# Patient Record
Sex: Female | Born: 1985 | Race: Black or African American | Hispanic: No | Smoking: Never smoker
Health system: Southern US, Community
[De-identification: ages and names within clinical notes are randomized; demographics above are authoritative.]

## PROBLEM LIST (undated history)

## (undated) DIAGNOSIS — Z789 Other specified health status: Secondary | ICD-10-CM

## (undated) HISTORY — PX: NO PAST SURGERIES: SHX2092

---

## 2005-02-07 ENCOUNTER — Ambulatory Visit (HOSPITAL_COMMUNITY): Admission: RE | Admit: 2005-02-07 | Discharge: 2005-02-07 | Payer: Self-pay | Admitting: Gynecology

## 2005-06-26 ENCOUNTER — Inpatient Hospital Stay (HOSPITAL_COMMUNITY): Admission: AD | Admit: 2005-06-26 | Discharge: 2005-06-28 | Payer: Self-pay | Admitting: Gynecology

## 2006-01-23 ENCOUNTER — Ambulatory Visit: Payer: Self-pay | Admitting: Gynecology

## 2006-03-29 ENCOUNTER — Ambulatory Visit: Payer: Self-pay | Admitting: Gynecology

## 2006-03-30 ENCOUNTER — Ambulatory Visit (HOSPITAL_COMMUNITY): Admission: RE | Admit: 2006-03-30 | Discharge: 2006-03-30 | Payer: Self-pay | Admitting: Gynecology

## 2006-04-12 ENCOUNTER — Ambulatory Visit: Payer: Self-pay | Admitting: Obstetrics & Gynecology

## 2006-04-12 ENCOUNTER — Ambulatory Visit: Payer: Self-pay | Admitting: Gynecology

## 2006-04-12 ENCOUNTER — Encounter: Payer: Self-pay | Admitting: Obstetrics & Gynecology

## 2006-05-03 ENCOUNTER — Ambulatory Visit (HOSPITAL_COMMUNITY): Admission: RE | Admit: 2006-05-03 | Discharge: 2006-05-03 | Payer: Self-pay | Admitting: Gynecology

## 2006-05-17 ENCOUNTER — Ambulatory Visit: Payer: Self-pay | Admitting: Obstetrics & Gynecology

## 2006-06-12 ENCOUNTER — Ambulatory Visit: Payer: Self-pay | Admitting: Family Medicine

## 2006-06-28 ENCOUNTER — Ambulatory Visit: Payer: Self-pay | Admitting: Gynecology

## 2006-07-10 ENCOUNTER — Ambulatory Visit: Payer: Self-pay | Admitting: Family Medicine

## 2006-07-24 ENCOUNTER — Ambulatory Visit: Payer: Self-pay | Admitting: Family Medicine

## 2006-08-07 ENCOUNTER — Ambulatory Visit: Payer: Self-pay | Admitting: Family Medicine

## 2006-08-21 ENCOUNTER — Ambulatory Visit: Payer: Self-pay | Admitting: Family Medicine

## 2006-08-28 ENCOUNTER — Ambulatory Visit: Payer: Self-pay | Admitting: Family Medicine

## 2006-09-04 ENCOUNTER — Ambulatory Visit: Payer: Self-pay | Admitting: Family Medicine

## 2006-09-13 ENCOUNTER — Ambulatory Visit: Payer: Self-pay | Admitting: Obstetrics & Gynecology

## 2006-09-19 ENCOUNTER — Inpatient Hospital Stay (HOSPITAL_COMMUNITY): Admission: AD | Admit: 2006-09-19 | Discharge: 2006-09-21 | Payer: Self-pay | Admitting: Gynecology

## 2006-09-19 ENCOUNTER — Ambulatory Visit: Payer: Self-pay | Admitting: Gynecology

## 2006-10-30 ENCOUNTER — Ambulatory Visit: Payer: Self-pay | Admitting: Family Medicine

## 2007-06-13 ENCOUNTER — Ambulatory Visit: Payer: Self-pay | Admitting: Gynecology

## 2007-11-26 ENCOUNTER — Ambulatory Visit: Payer: Self-pay | Admitting: Gynecology

## 2007-12-03 ENCOUNTER — Encounter: Payer: Self-pay | Admitting: Obstetrics & Gynecology

## 2007-12-03 ENCOUNTER — Ambulatory Visit: Payer: Self-pay | Admitting: Obstetrics & Gynecology

## 2007-12-18 ENCOUNTER — Ambulatory Visit: Payer: Self-pay | Admitting: Obstetrics & Gynecology

## 2007-12-27 ENCOUNTER — Ambulatory Visit (HOSPITAL_COMMUNITY): Admission: RE | Admit: 2007-12-27 | Discharge: 2007-12-27 | Payer: Self-pay | Admitting: Gynecology

## 2008-02-05 ENCOUNTER — Ambulatory Visit: Payer: Self-pay | Admitting: Gynecology

## 2008-02-13 ENCOUNTER — Ambulatory Visit (HOSPITAL_COMMUNITY): Admission: RE | Admit: 2008-02-13 | Discharge: 2008-02-13 | Payer: Self-pay | Admitting: Gynecology

## 2008-03-05 ENCOUNTER — Ambulatory Visit: Payer: Self-pay | Admitting: Gynecology

## 2008-04-07 ENCOUNTER — Ambulatory Visit: Payer: Self-pay | Admitting: Obstetrics and Gynecology

## 2008-05-13 ENCOUNTER — Ambulatory Visit: Payer: Self-pay | Admitting: Obstetrics and Gynecology

## 2008-05-28 ENCOUNTER — Ambulatory Visit: Payer: Self-pay | Admitting: Gynecology

## 2008-06-05 ENCOUNTER — Ambulatory Visit (HOSPITAL_COMMUNITY): Admission: RE | Admit: 2008-06-05 | Discharge: 2008-06-05 | Payer: Self-pay | Admitting: Gynecology

## 2008-06-10 ENCOUNTER — Ambulatory Visit: Payer: Self-pay | Admitting: Gynecology

## 2008-06-23 ENCOUNTER — Ambulatory Visit: Payer: Self-pay | Admitting: Family Medicine

## 2008-07-01 ENCOUNTER — Ambulatory Visit: Payer: Self-pay | Admitting: Obstetrics and Gynecology

## 2008-07-09 ENCOUNTER — Ambulatory Visit: Payer: Self-pay | Admitting: Obstetrics & Gynecology

## 2008-07-10 ENCOUNTER — Encounter (INDEPENDENT_AMBULATORY_CARE_PROVIDER_SITE_OTHER): Payer: Self-pay | Admitting: Gynecology

## 2008-07-10 ENCOUNTER — Ambulatory Visit: Payer: Self-pay | Admitting: Obstetrics & Gynecology

## 2008-07-10 LAB — CONVERTED CEMR LAB

## 2008-07-13 ENCOUNTER — Inpatient Hospital Stay (HOSPITAL_COMMUNITY): Admission: RE | Admit: 2008-07-13 | Discharge: 2008-07-13 | Payer: Self-pay | Admitting: Obstetrics & Gynecology

## 2008-07-13 ENCOUNTER — Ambulatory Visit: Payer: Self-pay | Admitting: Obstetrics & Gynecology

## 2008-07-13 ENCOUNTER — Ambulatory Visit: Payer: Self-pay | Admitting: Family Medicine

## 2008-07-15 ENCOUNTER — Ambulatory Visit: Payer: Self-pay | Admitting: Family

## 2008-07-15 ENCOUNTER — Inpatient Hospital Stay (HOSPITAL_COMMUNITY): Admission: RE | Admit: 2008-07-15 | Discharge: 2008-07-17 | Payer: Self-pay | Admitting: Obstetrics & Gynecology

## 2008-10-01 ENCOUNTER — Ambulatory Visit: Payer: Self-pay | Admitting: Obstetrics and Gynecology

## 2008-10-01 ENCOUNTER — Encounter: Payer: Self-pay | Admitting: Obstetrics and Gynecology

## 2008-12-22 ENCOUNTER — Ambulatory Visit: Payer: Self-pay | Admitting: Family Medicine

## 2009-03-16 ENCOUNTER — Ambulatory Visit: Payer: Self-pay | Admitting: Obstetrics & Gynecology

## 2009-05-04 ENCOUNTER — Ambulatory Visit: Payer: Self-pay | Admitting: Family Medicine

## 2009-06-10 ENCOUNTER — Ambulatory Visit: Payer: Self-pay | Admitting: Obstetrics & Gynecology

## 2009-11-22 ENCOUNTER — Ambulatory Visit: Payer: Self-pay | Admitting: Obstetrics & Gynecology

## 2009-11-24 ENCOUNTER — Ambulatory Visit: Payer: Self-pay | Admitting: Obstetrics & Gynecology

## 2010-02-15 ENCOUNTER — Ambulatory Visit: Payer: Self-pay | Admitting: Obstetrics and Gynecology

## 2010-08-02 ENCOUNTER — Ambulatory Visit: Payer: Self-pay | Admitting: Family Medicine

## 2010-10-09 ENCOUNTER — Encounter: Payer: Self-pay | Admitting: Obstetrics & Gynecology

## 2010-10-09 ENCOUNTER — Ambulatory Visit: Payer: Self-pay | Admitting: Family Medicine

## 2010-10-26 ENCOUNTER — Ambulatory Visit: Payer: Self-pay | Admitting: Obstetrics & Gynecology

## 2010-11-23 ENCOUNTER — Other Ambulatory Visit: Payer: Managed Care, Other (non HMO)

## 2010-11-23 DIAGNOSIS — Z3049 Encounter for surveillance of other contraceptives: Secondary | ICD-10-CM

## 2010-12-14 ENCOUNTER — Ambulatory Visit: Payer: Managed Care, Other (non HMO)

## 2010-12-22 ENCOUNTER — Ambulatory Visit: Payer: Managed Care, Other (non HMO) | Admitting: Obstetrics & Gynecology

## 2011-02-03 NOTE — Assessment & Plan Note (Signed)
NAME:  Beth [name], Beth [name]              ACCOUNT NO.:  [id number]   MEDICAL RECORD NO.:  [id number]          PATIENT TYPE:  POB   LOCATION:  [facility] at [facility]         FACILITY:  [facility]   PHYSICIAN:  Tinnie Gens, MD        DATE OF BIRTH:  1986/04/21   DATE OF SERVICE:                                  CLINIC NOTE   CHIEF COMPLAINT:  Postpartum check.   HISTORY OF PRESENT ILLNESS:  This is a 25 year old gravida 3, now para  3, who is six weeks postpartum from a vaginal delivery.  She had a female  infant.  She is continuing to breast feed, which is going well for her.  She had __________ prior to discharge.  She has not had sex yet.  The  patient reports that her baby is sleeping through the night.  She  reports that her mood is well.  She was depressed originally post-  delivery, but this has resolved.  She is sleeping well.  Her appetite is  fine.  She is working out and trying to lose weight.  The patient has  had some irregular spotting since delivery, probably most attributable  to Depo-Provera.  The patient appears to have gotten pregnant on  Micronor and does not want to try that method again.  The patient also  reports diminished sex drive and clearly being moody.   PHYSICAL EXAMINATION:  VITAL SIGNS:  As noted in the chart.  GENERAL:  She is a well-developed and well-nourished, moderately obese  female, in no acute distress.  PELVIC:  __________  genitalia.  Vagina is pink and rugated.  Cervix is  parous without lesions.  Uterus was small, anteverted.  No adnexal  masses or tenderness.   IMPRESSION:  1. Routine postpartum check.  2. On Depo-Provera.   PLAN:  1. The patient is given information regarding [employer] and [employer]      as alternatives to her current birth control regimen.  She will      look this over and let us know prior to her next Depo-Provera,      which one she would like to go with and schedule an appointment      appropriately.  2. Pap is due in July  2008.           ______________________________  Tinnie Gens, MD     TP/MEDQ  D:  10/30/2006  T:  10/30/2006  Job:  [id number]

## 2011-02-14 ENCOUNTER — Ambulatory Visit: Payer: Managed Care, Other (non HMO)

## 2011-02-27 ENCOUNTER — Ambulatory Visit (INDEPENDENT_AMBULATORY_CARE_PROVIDER_SITE_OTHER): Payer: Managed Care, Other (non HMO)

## 2011-02-27 DIAGNOSIS — Z3049 Encounter for surveillance of other contraceptives: Secondary | ICD-10-CM

## 2011-03-27 ENCOUNTER — Other Ambulatory Visit: Payer: Managed Care, Other (non HMO)

## 2011-06-19 LAB — CBC
Hemoglobin: 11.8 — ABNORMAL LOW
MCHC: 32.7
RBC: 4.11
RDW: 14

## 2011-06-19 LAB — RPR: RPR Ser Ql: NONREACTIVE

## 2011-07-12 ENCOUNTER — Other Ambulatory Visit: Payer: Managed Care, Other (non HMO) | Admitting: Family Medicine

## 2011-07-12 ENCOUNTER — Other Ambulatory Visit (INDEPENDENT_AMBULATORY_CARE_PROVIDER_SITE_OTHER): Payer: Managed Care, Other (non HMO) | Admitting: Gynecology

## 2011-07-12 DIAGNOSIS — N912 Amenorrhea, unspecified: Secondary | ICD-10-CM

## 2011-08-30 ENCOUNTER — Ambulatory Visit: Payer: Self-pay

## 2011-09-19 NOTE — L&D Delivery Note (Signed)
Delivery Note At 8:18 AM a viable and healthy female was delivered via Vaginal, Spontaneous Delivery (Presentation: Left Occiput Anterior).  APGAR: 8, 9; weight 7 lb 4 oz (3289 g).   Placenta status: Intact, Spontaneous.  Cord:  with the following complications: None.  Cord pH: n/a  Anesthesia: None  Episiotomy: None Lacerations: None Suture Repair: none Est. Blood Loss (mL): 300  Mom to postpartum.  Baby to nursery-stable.  Breast Depo  Sterling Ucci 06/07/2012, 9:09 AM

## 2011-09-19 NOTE — L&D Delivery Note (Signed)
Attended delivery Agree with note No difficulty with shoulders  Jarryn Altland CNM 

## 2011-09-28 ENCOUNTER — Other Ambulatory Visit (INDEPENDENT_AMBULATORY_CARE_PROVIDER_SITE_OTHER): Payer: Managed Care, Other (non HMO) | Admitting: Gynecology

## 2011-09-28 DIAGNOSIS — N390 Urinary tract infection, site not specified: Secondary | ICD-10-CM

## 2011-09-28 DIAGNOSIS — N912 Amenorrhea, unspecified: Secondary | ICD-10-CM

## 2011-09-28 LAB — POCT URINALYSIS DIPSTICK
Bilirubin, UA: NEGATIVE
Urobilinogen, UA: NEGATIVE
pH, UA: 6

## 2011-09-28 MED ORDER — AMOXICILLIN 500 MG PO CAPS: 500.0000 mg | ORAL_CAPSULE | Freq: Two times a day (BID) | ORAL | Status: DC

## 2011-09-28 NOTE — Progress Notes (Signed)
Addended by: Vinnie Langton C on: 09/28/2011 04:59 PM   Modules accepted: Orders

## 2011-10-02 ENCOUNTER — Other Ambulatory Visit: Payer: Self-pay | Admitting: Gynecology

## 2011-10-02 DIAGNOSIS — R8271 Bacteriuria: Secondary | ICD-10-CM

## 2011-10-02 MED ORDER — CIPROFLOXACIN HCL 500 MG PO TABS: 500.0000 mg | ORAL_TABLET | Freq: Two times a day (BID) | ORAL | Status: AC

## 2011-10-02 NOTE — Progress Notes (Signed)
Spoke with patient regarding urine result. Rx change to cipro 500mg . Patient aware and will pick up at pharmacy.

## 2011-10-10 ENCOUNTER — Ambulatory Visit: Payer: Managed Care, Other (non HMO) | Admitting: Obstetrics and Gynecology

## 2011-10-11 ENCOUNTER — Ambulatory Visit (INDEPENDENT_AMBULATORY_CARE_PROVIDER_SITE_OTHER): Payer: Managed Care, Other (non HMO) | Admitting: *Deleted

## 2011-10-11 VITALS — BP 119/65 | Wt 242.0 lb

## 2011-10-11 DIAGNOSIS — Z3480 Encounter for supervision of other normal pregnancy, unspecified trimester: Secondary | ICD-10-CM

## 2011-10-11 LAB — POCT URINE PREGNANCY: Preg Test, Ur: NEGATIVE

## 2011-10-11 MED ORDER — CONCEPT DHA 53.5-38-1 MG PO CAPS: 1.0000 | ORAL_CAPSULE | Freq: Every day | ORAL | Status: DC

## 2011-10-11 NOTE — Progress Notes (Signed)
Patient is here for new OB appt.  She is doing well and has had three positive home pregnancy tests.  She would like first trimester screening.

## 2011-10-12 ENCOUNTER — Other Ambulatory Visit: Payer: Self-pay | Admitting: Family Medicine

## 2011-10-12 DIAGNOSIS — Z3480 Encounter for supervision of other normal pregnancy, unspecified trimester: Secondary | ICD-10-CM

## 2011-10-12 LAB — OBSTETRIC PANEL
Basophils Absolute: 0 10*3/uL (ref 0.0–0.1)
Eosinophils Absolute: 0.1 10*3/uL (ref 0.0–0.7)
Eosinophils Relative: 1 % (ref 0–5)
Lymphs Abs: 2.1 10*3/uL (ref 0.7–4.0)
MCH: 29 pg (ref 26.0–34.0)
MCHC: 32.6 g/dL (ref 30.0–36.0)
MCV: 88.9 fL (ref 78.0–100.0)
Monocytes Absolute: 0.6 10*3/uL (ref 0.1–1.0)
Monocytes Relative: 6 % (ref 3–12)
Neutrophils Relative %: 74 % (ref 43–77)
Rubella: 38.6 IU/mL — ABNORMAL HIGH
WBC: 10.4 10*3/uL (ref 4.0–10.5)

## 2011-10-12 LAB — HIV ANTIBODY (ROUTINE TESTING W REFLEX): HIV: NONREACTIVE

## 2011-10-14 LAB — CULTURE, URINE COMPREHENSIVE

## 2011-10-16 ENCOUNTER — Other Ambulatory Visit: Payer: Self-pay | Admitting: Family Medicine

## 2011-10-16 ENCOUNTER — Encounter: Payer: Self-pay | Admitting: Family Medicine

## 2011-10-16 DIAGNOSIS — O2340 Unspecified infection of urinary tract in pregnancy, unspecified trimester: Secondary | ICD-10-CM | POA: Insufficient documentation

## 2011-10-16 DIAGNOSIS — B951 Streptococcus, group B, as the cause of diseases classified elsewhere: Secondary | ICD-10-CM

## 2011-10-16 MED ORDER — AMOXICILLIN 500 MG PO CAPS: 500.0000 mg | ORAL_CAPSULE | Freq: Three times a day (TID) | ORAL | Status: AC

## 2011-10-18 ENCOUNTER — Ambulatory Visit (HOSPITAL_COMMUNITY): Payer: Managed Care, Other (non HMO)

## 2011-10-19 ENCOUNTER — Ambulatory Visit (HOSPITAL_COMMUNITY)
Admission: RE | Admit: 2011-10-19 | Discharge: 2011-10-19 | Disposition: A | Discharge status: Home / Self Care | Payer: Managed Care, Other (non HMO) | Source: Ambulatory Visit | Attending: Family Medicine | Admitting: Family Medicine

## 2011-10-19 DIAGNOSIS — Z3689 Encounter for other specified antenatal screening: Secondary | ICD-10-CM | POA: Insufficient documentation

## 2011-10-19 DIAGNOSIS — Z3480 Encounter for supervision of other normal pregnancy, unspecified trimester: Secondary | ICD-10-CM

## 2011-10-23 ENCOUNTER — Encounter: Payer: Self-pay | Admitting: Obstetrics & Gynecology

## 2011-10-23 ENCOUNTER — Ambulatory Visit (INDEPENDENT_AMBULATORY_CARE_PROVIDER_SITE_OTHER): Payer: Managed Care, Other (non HMO) | Admitting: Obstetrics & Gynecology

## 2011-10-23 DIAGNOSIS — Z3480 Encounter for supervision of other normal pregnancy, unspecified trimester: Secondary | ICD-10-CM

## 2011-10-23 DIAGNOSIS — B951 Streptococcus, group B, as the cause of diseases classified elsewhere: Secondary | ICD-10-CM

## 2011-10-23 DIAGNOSIS — N390 Urinary tract infection, site not specified: Secondary | ICD-10-CM

## 2011-10-23 DIAGNOSIS — Z3490 Encounter for supervision of normal pregnancy, unspecified, unspecified trimester: Secondary | ICD-10-CM

## 2011-10-23 DIAGNOSIS — Z113 Encounter for screening for infections with a predominantly sexual mode of transmission: Secondary | ICD-10-CM

## 2011-10-23 DIAGNOSIS — B373 Candidiasis of vulva and vagina: Secondary | ICD-10-CM

## 2011-10-23 DIAGNOSIS — Z124 Encounter for screening for malignant neoplasm of cervix: Secondary | ICD-10-CM

## 2011-10-23 DIAGNOSIS — O2390 Unspecified genitourinary tract infection in pregnancy, unspecified trimester: Secondary | ICD-10-CM

## 2011-10-23 NOTE — Patient Instructions (Signed)
Pregnancy - First Trimester During sexual intercourse, millions of sperm go into the vagina. Only 1 sperm will penetrate and fertilize the female egg while it is in the Fallopian tube. One week later, the fertilized egg implants into the wall of the uterus. An embryo begins to develop into a baby. At 6 to 8 weeks, the eyes and face are formed and the heartbeat can be seen on ultrasound. At the end of 12 weeks (first trimester), all the baby's organs are formed. Now that you are pregnant, you will want to do everything you can to have a healthy baby. Two of the most important things are to get good prenatal care and follow your caregiver's instructions. Prenatal care is all the medical care you receive before the baby's birth. It is given to prevent, find, and treat problems during the pregnancy and childbirth. PRENATAL EXAMS  During prenatal visits, your weight, blood pressure and urine are checked. This is done to make sure you are healthy and progressing normally during the pregnancy.   A pregnant woman should gain 25 to 35 pounds during the pregnancy. However, if you are over weight or underweight, your caregiver will advise you regarding your weight.   Your caregiver will ask and answer questions for you.   Blood work, cervical cultures, other necessary tests and a Pap test are done during your prenatal exams. These tests are done to check on your health and the probable health of your baby. Tests are strongly recommended and done for HIV with your permission. This is the virus that causes AIDS. These tests are done because medications can be given to help prevent your baby from being born with this infection should you have been infected without knowing it. Blood work is also used to find out your blood type, previous infections and follow your blood levels (hemoglobin).   Low hemoglobin (anemia) is common during pregnancy. Iron and vitamins are given to help prevent this. Later in the pregnancy,  blood tests for diabetes will be done along with any other tests if any problems develop. You may need tests to make sure you and the baby are doing well.   You may need other tests to make sure you and the baby are doing well.  CHANGES DURING THE FIRST TRIMESTER (THE FIRST 3 MONTHS OF PREGNANCY) Your body goes through many changes during pregnancy. They vary from person to person. Talk to your caregiver about changes you notice and are concerned about. Changes can include:  Your menstrual period stops.   The egg and sperm carry the genes that determine what you look like. Genes from you and your partner are forming a baby. The female genes determine whether the baby is a boy or a girl.   Your body increases in girth and you may feel bloated.   Feeling sick to your stomach (nauseous) and throwing up (vomiting). If the vomiting is uncontrollable, call your caregiver.   Your breasts will begin to enlarge and become tender.   Your nipples may stick out more and become darker.   The need to urinate more. Painful urination may mean you have a bladder infection.   Tiring easily.   Loss of appetite.   Cravings for certain kinds of food.   At first, you may gain or lose a couple of pounds.   You may have changes in your emotions from day to day (excited to be pregnant or concerned something may go wrong with the pregnancy and baby).     You may have more vivid and strange dreams.  HOME CARE INSTRUCTIONS   It is very important to avoid all smoking, alcohol and un-prescribed drugs during your pregnancy. These affect the formation and growth of the baby. Avoid chemicals while pregnant to ensure the delivery of a healthy infant.   Start your prenatal visits by the 12th week of pregnancy. They are usually scheduled monthly at first, then more often in the last 2 months before delivery. Keep your caregiver's appointments. Follow your caregiver's instructions regarding medication use, blood and lab  tests, exercise, and diet.   During pregnancy, you are providing food for you and your baby. Eat regular, well-balanced meals. Choose foods such as meat, fish, milk and other low fat dairy products, vegetables, fruits, and whole-grain breads and cereals. Your caregiver will tell you of the ideal weight gain.   You can help morning sickness by keeping soda crackers at the bedside. Eat a couple before arising in the morning. You may want to use the crackers without salt on them.   Eating 4 to 5 small meals rather than 3 large meals a day also may help the nausea and vomiting.   Drinking liquids between meals instead of during meals also seems to help nausea and vomiting.   A physical sexual relationship may be continued throughout pregnancy if there are no other problems. Problems may be early (premature) leaking of amniotic fluid from the membranes, vaginal bleeding, or belly (abdominal) pain.   Exercise regularly if there are no restrictions. Check with your caregiver or physical therapist if you are unsure of the safety of some of your exercises. Greater weight gain will occur in the last 2 trimesters of pregnancy. Exercising will help:   Control your weight.   Keep you in shape.   Prepare you for labor and delivery.   Help you lose your pregnancy weight after you deliver your baby.   Wear a good support or jogging bra for breast tenderness during pregnancy. This may help if worn during sleep too.   Ask when prenatal classes are available. Begin classes when they are offered.   Do not use hot tubs, steam rooms or saunas.   Wear your seat belt when driving. This protects you and your baby if you are in an accident.   Avoid raw meat, uncooked cheese, cat litter boxes and soil used by cats throughout the pregnancy. These carry germs that can cause birth defects in the baby.   The first trimester is a good time to visit your dentist for your dental health. Getting your teeth cleaned is  OK. Use a softer toothbrush and brush gently during pregnancy.   Ask for help if you have financial, counseling or nutritional needs during pregnancy. Your caregiver will be able to offer counseling for these needs as well as refer you for other special needs.   Do not take any medications or herbs unless told by your caregiver.   Inform your caregiver if there is any mental or physical domestic violence.   Make a list of emergency phone numbers of family, friends, hospital, and police and fire departments.   Write down your questions. Take them to your prenatal visit.   Do not douche.   Do not cross your legs.   If you have to stand for long periods of time, rotate you feet or take small steps in a circle.   You may have more vaginal secretions that may require a sanitary pad. Do not use   tampons or scented sanitary pads.  MEDICATIONS AND DRUG USE IN PREGNANCY  Take prenatal vitamins as directed. The vitamin should contain 1 milligram of folic acid. Keep all vitamins out of reach of children. Only a couple vitamins or tablets containing iron may be fatal to a baby or young child when ingested.   Avoid use of all medications, including herbs, over-the-counter medications, not prescribed or suggested by your caregiver. Only take over-the-counter or prescription medicines for pain, discomfort, or fever as directed by your caregiver. Do not use aspirin, ibuprofen, or naproxen unless directed by your caregiver.   Let your caregiver also know about herbs you may be using.   Alcohol is related to a number of birth defects. This includes fetal alcohol syndrome. All alcohol, in any form, should be avoided completely. Smoking will cause low birth rate and premature babies.   Street or illegal drugs are very harmful to the baby. They are absolutely forbidden. A baby born to an addicted mother will be addicted at birth. The baby will go through the same withdrawal an adult does.   Let your  caregiver know about any medications that you have to take and for what reason you take them.  MISCARRIAGE IS COMMON DURING PREGNANCY A miscarriage does not mean you did something wrong. It is not a reason to worry about getting pregnant again. Your caregiver will help you with questions you may have. If you have a miscarriage, you may need minor surgery. SEEK MEDICAL CARE IF:  You have any concerns or worries during your pregnancy. It is better to call with your questions if you feel they cannot wait, rather than worry about them. SEEK IMMEDIATE MEDICAL CARE IF:   An unexplained oral temperature above 102 F (38.9 C) develops, or as your caregiver suggests.   You have leaking of fluid from the vagina (birth canal). If leaking membranes are suspected, take your temperature and inform your caregiver of this when you call.   There is vaginal spotting or bleeding. Notify your caregiver of the amount and how many pads are used.   You develop a bad smelling vaginal discharge with a change in the color.   You continue to feel sick to your stomach (nauseated) and have no relief from remedies suggested. You vomit blood or coffee ground-like materials.   You lose more than 2 pounds of weight in 1 week.   You gain more than 2 pounds of weight in 1 week and you notice swelling of your face, hands, feet, or legs.   You gain 5 pounds or more in 1 week (even if you do not have swelling of your hands, face, legs, or feet).   You get exposed to German measles and have never had them.   You are exposed to fifth disease or chickenpox.   You develop belly (abdominal) pain. Round ligament discomfort is a common non-cancerous (benign) cause of abdominal pain in pregnancy. Your caregiver still must evaluate this.   You develop headache, fever, diarrhea, pain with urination, or shortness of breath.   You fall or are in a car accident or have any kind of trauma.   There is mental or physical violence in  your home.  Document Released: 08/29/2001 Document Revised: 05/17/2011 Document Reviewed: 03/02/2009 [employer] Patient Information 2012 [employer]. 

## 2011-10-23 NOTE — Progress Notes (Signed)
   Subjective:    Beth [name] is a [id number] [redacted]w[redacted]d being seen today for her first obstetrical visit.  Her obstetrical history is significant for pre-eclampsia during her first pregnancy.  Pregnancy history fully reviewed.  Patient reports no complaints.  Filed Vitals:   10/23/11 1500  BP: 119/65  Weight: 247 lb (112.038 kg)    HISTORY: OB History    Grav Para Term Preterm Abortions TAB SAB Ect Mult Living   5 4 4  0 0 0 0 0 0 4     # Outc Date GA Lbr Len/2nd Wgt Sex Del Anes PTL Lv   1 TRM            2 TRM            3 TRM            4 TRM            5 CUR              No past medical history on file. No past surgical history on file. Family History  Problem Relation Age of Onset  . Diabetes type I Father 44  . Hypertension Father      Exam    Uterine Size: not assessed  Pelvic Exam:    Perineum: Normal Perineum   Vulva: normal   Vagina:  normal mucosa, normal discharge   pH: Not done   Cervix: no bleeding following Pap   Adnexa: normal adnexa and no mass, fullness, tenderness   Bony Pelvis: gynecoid  System: Breast:  normal appearance, no masses or tenderness   Skin: normal coloration and turgor, no rashes    Neurologic: oriented, normal, negative, normal mood   Extremities: normal strength, tone, and muscle mass   HEENT PERRLA   Mouth/Teeth mucous membranes moist, pharynx normal without lesions   Neck supple and no masses   Cardiovascular: regular rate and rhythm   Respiratory:  appears well, vitals normal, no respiratory distress, acyanotic, normal RR, neck free of mass or lymphadenopathy   Abdomen: soft, non-tender; bowel sounds normal; no masses,  no organomegaly   Urinary: urethral meatus normal      Assessment:    Pregnancy: [id number] Patient Active Problem List  Diagnoses  . GBS (group B streptococcus) UTI complicating pregnancy  . Supervision of normal pregnancy        Plan:     Continue prenatal vitamins and antibiotics for GBS  bacteuria. Problem list reviewed and updated. Genetic Screening discussed First Screen: ordered.  Ultrasound discussed; fetal survey: will be ordered between 18-20 weeks.  Follow up in 4 weeks.  Beth [name] A 10/23/2011

## 2011-10-30 MED ORDER — FLUCONAZOLE 150 MG PO TABS: 150.0000 mg | ORAL_TABLET | Freq: Once | ORAL | Status: AC

## 2011-10-30 NOTE — Progress Notes (Signed)
Addended by: Jaynie Collins A on: 10/30/2011 01:52 PM   Modules accepted: Orders

## 2011-11-01 MED ORDER — ONDANSETRON HCL 8 MG PO TABS: 8.0000 mg | ORAL_TABLET | Freq: Three times a day (TID) | ORAL | Status: AC | PRN

## 2011-11-01 NOTE — Progress Notes (Signed)
Addended by: Barbara Cower on: 11/01/2011 04:25 PM   Modules accepted: Orders

## 2011-11-01 NOTE — Progress Notes (Signed)
Left message for patient to call me back. 

## 2011-11-20 ENCOUNTER — Ambulatory Visit (INDEPENDENT_AMBULATORY_CARE_PROVIDER_SITE_OTHER): Payer: Managed Care, Other (non HMO) | Admitting: Obstetrics & Gynecology

## 2011-11-20 ENCOUNTER — Other Ambulatory Visit: Payer: Self-pay | Admitting: Obstetrics & Gynecology

## 2011-11-20 DIAGNOSIS — Z3480 Encounter for supervision of other normal pregnancy, unspecified trimester: Secondary | ICD-10-CM

## 2011-11-20 DIAGNOSIS — O99210 Obesity complicating pregnancy, unspecified trimester: Secondary | ICD-10-CM | POA: Insufficient documentation

## 2011-11-20 DIAGNOSIS — Z23 Encounter for immunization: Secondary | ICD-10-CM

## 2011-11-20 DIAGNOSIS — Z369 Encounter for antenatal screening, unspecified: Secondary | ICD-10-CM

## 2011-11-20 DIAGNOSIS — E669 Obesity, unspecified: Secondary | ICD-10-CM | POA: Insufficient documentation

## 2011-11-20 NOTE — Progress Notes (Signed)
Routine visit. Wants flu shot today. Has 2 skin lesions on abdomen that may be ring worm. I will have her come back in to see Dr. Shawnie Pons for confirmation. She wants first trimester screening.

## 2011-12-06 ENCOUNTER — Other Ambulatory Visit (HOSPITAL_COMMUNITY): Payer: Managed Care, Other (non HMO)

## 2011-12-08 ENCOUNTER — Ambulatory Visit (HOSPITAL_COMMUNITY)
Admission: RE | Admit: 2011-12-08 | Discharge: 2011-12-08 | Disposition: A | Discharge status: Home / Self Care | Payer: Managed Care, Other (non HMO) | Source: Ambulatory Visit | Attending: Obstetrics & Gynecology | Admitting: Obstetrics & Gynecology

## 2011-12-08 ENCOUNTER — Other Ambulatory Visit: Payer: Self-pay

## 2011-12-08 DIAGNOSIS — O09299 Supervision of pregnancy with other poor reproductive or obstetric history, unspecified trimester: Secondary | ICD-10-CM | POA: Insufficient documentation

## 2011-12-08 DIAGNOSIS — Z369 Encounter for antenatal screening, unspecified: Secondary | ICD-10-CM

## 2011-12-08 DIAGNOSIS — E669 Obesity, unspecified: Secondary | ICD-10-CM | POA: Insufficient documentation

## 2011-12-08 DIAGNOSIS — Z3689 Encounter for other specified antenatal screening: Secondary | ICD-10-CM | POA: Insufficient documentation

## 2011-12-08 DIAGNOSIS — O3510X0 Maternal care for (suspected) chromosomal abnormality in fetus, unspecified, not applicable or unspecified: Secondary | ICD-10-CM | POA: Insufficient documentation

## 2011-12-08 DIAGNOSIS — O351XX0 Maternal care for (suspected) chromosomal abnormality in fetus, not applicable or unspecified: Secondary | ICD-10-CM | POA: Insufficient documentation

## 2011-12-18 ENCOUNTER — Ambulatory Visit (INDEPENDENT_AMBULATORY_CARE_PROVIDER_SITE_OTHER): Payer: Managed Care, Other (non HMO) | Admitting: Family Medicine

## 2011-12-18 VITALS — BP 145/75 | Ht 66.0 in | Wt 248.0 lb

## 2011-12-18 DIAGNOSIS — Z3490 Encounter for supervision of normal pregnancy, unspecified, unspecified trimester: Secondary | ICD-10-CM

## 2011-12-18 DIAGNOSIS — Z3480 Encounter for supervision of other normal pregnancy, unspecified trimester: Secondary | ICD-10-CM

## 2011-12-18 NOTE — Patient Instructions (Signed)
Pregnancy - Second Trimester The second trimester of pregnancy (3 to 6 months) is a period of rapid growth for you and your baby. At the end of the sixth month, your baby is about 9 inches long and weighs 1 1/2 pounds. You will begin to feel the baby move between 18 and 20 weeks of the pregnancy. This is called quickening. Weight gain is faster. A clear fluid (colostrum) may leak out of your breasts. You may feel small contractions of the womb (uterus). This is known as false labor or Braxton-Hicks contractions. This is like a practice for labor when the baby is ready to be born. Usually, the problems with morning sickness have usually passed by the end of your first trimester. Some women develop small dark blotches (called cholasma, mask of pregnancy) on their face that usually goes away after the baby is born. Exposure to the sun makes the blotches worse. Acne may also develop in some pregnant women and pregnant women who have acne, may find that it goes away. PRENATAL EXAMS  Blood work may continue to be done during prenatal exams. These tests are done to check on your health and the probable health of your baby. Blood work is used to follow your blood levels (hemoglobin). Anemia (low hemoglobin) is common during pregnancy. Iron and vitamins are given to help prevent this. You will also be checked for diabetes between 24 and 28 weeks of the pregnancy. Some of the previous blood tests may be repeated.   The size of the uterus is measured during each visit. This is to make sure that the baby is continuing to grow properly according to the dates of the pregnancy.   Your blood pressure is checked every prenatal visit. This is to make sure you are not getting toxemia.   Your urine is checked to make sure you do not have an infection, diabetes or protein in the urine.   Your weight is checked often to make sure gains are happening at the suggested rate. This is to ensure that both you and your baby are  growing normally.   Sometimes, an ultrasound is performed to confirm the proper growth and development of the baby. This is a test which bounces harmless sound waves off the baby so your caregiver can more accurately determine due dates.  Sometimes, a specialized test is done on the amniotic fluid surrounding the baby. This test is called an amniocentesis. The amniotic fluid is obtained by sticking a needle into the belly (abdomen). This is done to check the chromosomes in instances where there is a concern about possible genetic problems with the baby. It is also sometimes done near the end of pregnancy if an early delivery is required. In this case, it is done to help make sure the baby's lungs are mature enough for the baby to live outside of the womb. CHANGES OCCURING IN THE SECOND TRIMESTER OF PREGNANCY Your body goes through many changes during pregnancy. They vary from person to person. Talk to your caregiver about changes you notice that you are concerned about.  During the second trimester, you will likely have an increase in your appetite. It is normal to have cravings for certain foods. This varies from person to person and pregnancy to pregnancy.   Your lower abdomen will begin to bulge.   You may have to urinate more often because the uterus and baby are pressing on your bladder. It is also common to get more bladder infections during pregnancy (  pain with urination). You can help this by drinking lots of fluids and emptying your bladder before and after intercourse.   You may begin to get stretch marks on your hips, abdomen, and breasts. These are normal changes in the body during pregnancy. There are no exercises or medications to take that prevent this change.   You may begin to develop swollen and bulging veins (varicose veins) in your legs. Wearing support hose, elevating your feet for 15 minutes, 3 to 4 times a day and limiting salt in your diet helps lessen the problem.    Heartburn may develop as the uterus grows and pushes up against the stomach. Antacids recommended by your caregiver helps with this problem. Also, eating smaller meals 4 to 5 times a day helps.   Constipation can be treated with a stool softener or adding bulk to your diet. Drinking lots of fluids, vegetables, fruits, and whole grains are helpful.   Exercising is also helpful. If you have been very active up until your pregnancy, most of these activities can be continued during your pregnancy. If you have been less active, it is helpful to start an exercise program such as walking.   Hemorrhoids (varicose veins in the rectum) may develop at the end of the second trimester. Warm sitz baths and hemorrhoid cream recommended by your caregiver helps hemorrhoid problems.   Backaches may develop during this time of your pregnancy. Avoid heavy lifting, wear low heal shoes and practice good posture to help with backache problems.   Some pregnant women develop tingling and numbness of their hand and fingers because of swelling and tightening of ligaments in the wrist (carpel tunnel syndrome). This goes away after the baby is born.   As your breasts enlarge, you may have to get a bigger bra. Get a comfortable, cotton, support bra. Do not get a nursing bra until the last month of the pregnancy if you will be nursing the baby.   You may get a dark line from your belly button to the pubic area called the linea nigra.   You may develop rosy cheeks because of increase blood flow to the face.   You may develop spider looking lines of the face, neck, arms and chest. These go away after the baby is born.  HOME CARE INSTRUCTIONS   It is extremely important to avoid all smoking, herbs, alcohol, and unprescribed drugs during your pregnancy. These chemicals affect the formation and growth of the baby. Avoid these chemicals throughout the pregnancy to ensure the delivery of a healthy infant.   Most of your home  care instructions are the same as suggested for the first trimester of your pregnancy. Keep your caregiver's appointments. Follow your caregiver's instructions regarding medication use, exercise and diet.   During pregnancy, you are providing food for you and your baby. Continue to eat regular, well-balanced meals. Choose foods such as meat, fish, milk and other low fat dairy products, vegetables, fruits, and whole-grain breads and cereals. Your caregiver will tell you of the ideal weight gain.   A physical sexual relationship may be continued up until near the end of pregnancy if there are no other problems. Problems could include early (premature) leaking of amniotic fluid from the membranes, vaginal bleeding, abdominal pain, or other medical or pregnancy problems.   Exercise regularly if there are no restrictions. Check with your caregiver if you are unsure of the safety of some of your exercises. The greatest weight gain will occur in the   last 2 trimesters of pregnancy. Exercise will help you:   Control your weight.   Get you in shape for labor and delivery.   Lose weight after you have the baby.   Wear a good support or jogging bra for breast tenderness during pregnancy. This may help if worn during sleep. Pads or tissues may be used in the bra if you are leaking colostrum.   Do not use hot tubs, steam rooms or saunas throughout the pregnancy.   Wear your seat belt at all times when driving. This protects you and your baby if you are in an accident.   Avoid raw meat, uncooked cheese, cat litter boxes and soil used by cats. These carry germs that can cause birth defects in the baby.   The second trimester is also a good time to visit your dentist for your dental health if this has not been done yet. Getting your teeth cleaned is OK. Use a soft toothbrush. Brush gently during pregnancy.   It is easier to loose urine during pregnancy. Tightening up and strengthening the pelvic muscles will  help with this problem. Practice stopping your urination while you are going to the bathroom. These are the same muscles you need to strengthen. It is also the muscles you would use as if you were trying to stop from passing gas. You can practice tightening these muscles up 10 times a set and repeating this about 3 times per day. Once you know what muscles to tighten up, do not perform these exercises during urination. It is more likely to contribute to an infection by backing up the urine.   Ask for help if you have financial, counseling or nutritional needs during pregnancy. Your caregiver will be able to offer counseling for these needs as well as refer you for other special needs.   Your skin may become oily. If so, wash your face with mild soap, use non-greasy moisturizer and oil or cream based makeup.  MEDICATIONS AND DRUG USE IN PREGNANCY  Take prenatal vitamins as directed. The vitamin should contain 1 milligram of folic acid. Keep all vitamins out of reach of children. Only a couple vitamins or tablets containing iron may be fatal to a baby or young child when ingested.   Avoid use of all medications, including herbs, over-the-counter medications, not prescribed or suggested by your caregiver. Only take over-the-counter or prescription medicines for pain, discomfort, or fever as directed by your caregiver. Do not use aspirin.   Let your caregiver also know about herbs you may be using.   Alcohol is related to a number of birth defects. This includes fetal alcohol syndrome. All alcohol, in any form, should be avoided completely. Smoking will cause low birth rate and premature babies.   Street or illegal drugs are very harmful to the baby. They are absolutely forbidden. A baby born to an addicted mother will be addicted at birth. The baby will go through the same withdrawal an adult does.  SEEK MEDICAL CARE IF:  You have any concerns or worries during your pregnancy. It is better to call with  your questions if you feel they cannot wait, rather than worry about them. SEEK IMMEDIATE MEDICAL CARE IF:   An unexplained oral temperature above 102 F (38.9 C) develops, or as your caregiver suggests.   You have leaking of fluid from the vagina (birth canal). If leaking membranes are suspected, take your temperature and tell your caregiver of this when you call.   There   is vaginal spotting, bleeding, or passing clots. Tell your caregiver of the amount and how many pads are used. Light spotting in pregnancy is common, especially following intercourse.   You develop a bad smelling vaginal discharge with a change in the color from clear to white.   You continue to feel sick to your stomach (nauseated) and have no relief from remedies suggested. You vomit blood or coffee ground-like materials.   You lose more than 2 pounds of weight or gain more than 2 pounds of weight over 1 week, or as suggested by your caregiver.   You notice swelling of your face, hands, feet, or legs.   You get exposed to German measles and have never had them.   You are exposed to fifth disease or chickenpox.   You develop belly (abdominal) pain. Round ligament discomfort is a common non-cancerous (benign) cause of abdominal pain in pregnancy. Your caregiver still must evaluate you.   You develop a bad headache that does not go away.   You develop fever, diarrhea, pain with urination, or shortness of breath.   You develop visual problems, blurry, or double vision.   You fall or are in a car accident or any kind of trauma.   There is mental or physical violence at home.  Document Released: 08/29/2001 Document Revised: 08/24/2011 Document Reviewed: 03/03/2009 [employer] Patient Information 2012 [employer]. 

## 2011-12-18 NOTE — Progress Notes (Signed)
[id number] 

## 2011-12-18 NOTE — Progress Notes (Signed)
Doing well.  Suspect spots on abdomen are eczema---treatments reviewed.

## 2012-01-01 ENCOUNTER — Encounter: Payer: Self-pay | Admitting: Obstetrics & Gynecology

## 2012-01-16 ENCOUNTER — Ambulatory Visit (INDEPENDENT_AMBULATORY_CARE_PROVIDER_SITE_OTHER): Payer: Managed Care, Other (non HMO) | Admitting: Obstetrics and Gynecology

## 2012-01-16 ENCOUNTER — Encounter: Payer: Self-pay | Admitting: Obstetrics and Gynecology

## 2012-01-16 VITALS — BP 125/64 | Wt 251.4 lb

## 2012-01-16 DIAGNOSIS — O99210 Obesity complicating pregnancy, unspecified trimester: Secondary | ICD-10-CM

## 2012-01-16 DIAGNOSIS — N390 Urinary tract infection, site not specified: Secondary | ICD-10-CM

## 2012-01-16 DIAGNOSIS — E669 Obesity, unspecified: Secondary | ICD-10-CM

## 2012-01-16 DIAGNOSIS — Z3480 Encounter for supervision of other normal pregnancy, unspecified trimester: Secondary | ICD-10-CM

## 2012-01-16 DIAGNOSIS — O2390 Unspecified genitourinary tract infection in pregnancy, unspecified trimester: Secondary | ICD-10-CM

## 2012-01-16 DIAGNOSIS — Z3490 Encounter for supervision of normal pregnancy, unspecified, unspecified trimester: Secondary | ICD-10-CM

## 2012-01-16 DIAGNOSIS — B951 Streptococcus, group B, as the cause of diseases classified elsewhere: Secondary | ICD-10-CM

## 2012-01-16 NOTE — Progress Notes (Signed)
Patient doing well without complaints. MSAFP done today. Will schedule anatomy ultrasound

## 2012-01-18 ENCOUNTER — Ambulatory Visit (HOSPITAL_COMMUNITY)
Admission: RE | Admit: 2012-01-18 | Discharge: 2012-01-18 | Disposition: A | Discharge status: Home / Self Care | Payer: Managed Care, Other (non HMO) | Source: Ambulatory Visit | Attending: Obstetrics and Gynecology | Admitting: Obstetrics and Gynecology

## 2012-01-18 DIAGNOSIS — O358XX0 Maternal care for other (suspected) fetal abnormality and damage, not applicable or unspecified: Secondary | ICD-10-CM | POA: Insufficient documentation

## 2012-01-18 DIAGNOSIS — Z3490 Encounter for supervision of normal pregnancy, unspecified, unspecified trimester: Secondary | ICD-10-CM

## 2012-01-18 DIAGNOSIS — O99210 Obesity complicating pregnancy, unspecified trimester: Secondary | ICD-10-CM | POA: Insufficient documentation

## 2012-01-18 DIAGNOSIS — E669 Obesity, unspecified: Secondary | ICD-10-CM | POA: Insufficient documentation

## 2012-01-18 DIAGNOSIS — O09299 Supervision of pregnancy with other poor reproductive or obstetric history, unspecified trimester: Secondary | ICD-10-CM | POA: Insufficient documentation

## 2012-01-24 ENCOUNTER — Other Ambulatory Visit: Payer: Self-pay | Admitting: Obstetrics and Gynecology

## 2012-01-24 DIAGNOSIS — Z09 Encounter for follow-up examination after completed treatment for conditions other than malignant neoplasm: Secondary | ICD-10-CM

## 2012-01-31 ENCOUNTER — Telehealth: Payer: Self-pay | Admitting: *Deleted

## 2012-01-31 DIAGNOSIS — Z3480 Encounter for supervision of other normal pregnancy, unspecified trimester: Secondary | ICD-10-CM

## 2012-01-31 DIAGNOSIS — Z09 Encounter for follow-up examination after completed treatment for conditions other than malignant neoplasm: Secondary | ICD-10-CM

## 2012-01-31 NOTE — Telephone Encounter (Signed)
Radiology called and asked for u/s order to be released/tn

## 2012-02-01 ENCOUNTER — Ambulatory Visit (HOSPITAL_COMMUNITY)
Admission: RE | Admit: 2012-02-01 | Discharge: 2012-02-01 | Disposition: A | Discharge status: Home / Self Care | Payer: Managed Care, Other (non HMO) | Source: Ambulatory Visit | Attending: Obstetrics and Gynecology | Admitting: Obstetrics and Gynecology

## 2012-02-01 DIAGNOSIS — O358XX0 Maternal care for other (suspected) fetal abnormality and damage, not applicable or unspecified: Secondary | ICD-10-CM | POA: Insufficient documentation

## 2012-02-01 DIAGNOSIS — Z363 Encounter for antenatal screening for malformations: Secondary | ICD-10-CM | POA: Insufficient documentation

## 2012-02-01 DIAGNOSIS — Z1389 Encounter for screening for other disorder: Secondary | ICD-10-CM | POA: Insufficient documentation

## 2012-02-02 ENCOUNTER — Encounter: Payer: Self-pay | Admitting: Obstetrics and Gynecology

## 2012-02-20 ENCOUNTER — Encounter: Payer: Self-pay | Admitting: Family Medicine

## 2012-02-20 ENCOUNTER — Ambulatory Visit (INDEPENDENT_AMBULATORY_CARE_PROVIDER_SITE_OTHER): Payer: Managed Care, Other (non HMO) | Admitting: Family Medicine

## 2012-02-20 DIAGNOSIS — Z3480 Encounter for supervision of other normal pregnancy, unspecified trimester: Secondary | ICD-10-CM

## 2012-02-20 NOTE — Progress Notes (Signed)
Doing well today--no complaints.

## 2012-02-20 NOTE — Patient Instructions (Signed)
Pregnancy - Second Trimester The second trimester of pregnancy (3 to 6 months) is a period of rapid growth for you and your baby. At the end of the sixth month, your baby is about 9 inches long and weighs 1 1/2 pounds. You will begin to feel the baby move between 18 and 20 weeks of the pregnancy. This is called quickening. Weight gain is faster. A clear fluid (colostrum) may leak out of your breasts. You may feel small contractions of the womb (uterus). This is known as false labor or Braxton-Hicks contractions. This is like a practice for labor when the baby is ready to be born. Usually, the problems with morning sickness have usually passed by the end of your first trimester. Some women develop small dark blotches (called cholasma, mask of pregnancy) on their face that usually goes away after the baby is born. Exposure to the sun makes the blotches worse. Acne may also develop in some pregnant women and pregnant women who have acne, may find that it goes away. PRENATAL EXAMS  Blood work may continue to be done during prenatal exams. These tests are done to check on your health and the probable health of your baby. Blood work is used to follow your blood levels (hemoglobin). Anemia (low hemoglobin) is common during pregnancy. Iron and vitamins are given to help prevent this. You will also be checked for diabetes between 24 and 28 weeks of the pregnancy. Some of the previous blood tests may be repeated.   The size of the uterus is measured during each visit. This is to make sure that the baby is continuing to grow properly according to the dates of the pregnancy.   Your blood pressure is checked every prenatal visit. This is to make sure you are not getting toxemia.   Your urine is checked to make sure you do not have an infection, diabetes or protein in the urine.   Your weight is checked often to make sure gains are happening at the suggested rate. This is to ensure that both you and your baby are  growing normally.   Sometimes, an ultrasound is performed to confirm the proper growth and development of the baby. This is a test which bounces harmless sound waves off the baby so your caregiver can more accurately determine due dates.  Sometimes, a specialized test is done on the amniotic fluid surrounding the baby. This test is called an amniocentesis. The amniotic fluid is obtained by sticking a needle into the belly (abdomen). This is done to check the chromosomes in instances where there is a concern about possible genetic problems with the baby. It is also sometimes done near the end of pregnancy if an early delivery is required. In this case, it is done to help make sure the baby's lungs are mature enough for the baby to live outside of the womb. CHANGES OCCURING IN THE SECOND TRIMESTER OF PREGNANCY Your body goes through many changes during pregnancy. They vary from person to person. Talk to your caregiver about changes you notice that you are concerned about.  During the second trimester, you will likely have an increase in your appetite. It is normal to have cravings for certain foods. This varies from person to person and pregnancy to pregnancy.   Your lower abdomen will begin to bulge.   You may have to urinate more often because the uterus and baby are pressing on your bladder. It is also common to get more bladder infections during pregnancy (  pain with urination). You can help this by drinking lots of fluids and emptying your bladder before and after intercourse.   You may begin to get stretch marks on your hips, abdomen, and breasts. These are normal changes in the body during pregnancy. There are no exercises or medications to take that prevent this change.   You may begin to develop swollen and bulging veins (varicose veins) in your legs. Wearing support hose, elevating your feet for 15 minutes, 3 to 4 times a day and limiting salt in your diet helps lessen the problem.    Heartburn may develop as the uterus grows and pushes up against the stomach. Antacids recommended by your caregiver helps with this problem. Also, eating smaller meals 4 to 5 times a day helps.   Constipation can be treated with a stool softener or adding bulk to your diet. Drinking lots of fluids, vegetables, fruits, and whole grains are helpful.   Exercising is also helpful. If you have been very active up until your pregnancy, most of these activities can be continued during your pregnancy. If you have been less active, it is helpful to start an exercise program such as walking.   Hemorrhoids (varicose veins in the rectum) may develop at the end of the second trimester. Warm sitz baths and hemorrhoid cream recommended by your caregiver helps hemorrhoid problems.   Backaches may develop during this time of your pregnancy. Avoid heavy lifting, wear low heal shoes and practice good posture to help with backache problems.   Some pregnant women develop tingling and numbness of their hand and fingers because of swelling and tightening of ligaments in the wrist (carpel tunnel syndrome). This goes away after the baby is born.   As your breasts enlarge, you may have to get a bigger bra. Get a comfortable, cotton, support bra. Do not get a nursing bra until the last month of the pregnancy if you will be nursing the baby.   You may get a dark line from your belly button to the pubic area called the linea nigra.   You may develop rosy cheeks because of increase blood flow to the face.   You may develop spider looking lines of the face, neck, arms and chest. These go away after the baby is born.  HOME CARE INSTRUCTIONS   It is extremely important to avoid all smoking, herbs, alcohol, and unprescribed drugs during your pregnancy. These chemicals affect the formation and growth of the baby. Avoid these chemicals throughout the pregnancy to ensure the delivery of a healthy infant.   Most of your home  care instructions are the same as suggested for the first trimester of your pregnancy. Keep your caregiver's appointments. Follow your caregiver's instructions regarding medication use, exercise and diet.   During pregnancy, you are providing food for you and your baby. Continue to eat regular, well-balanced meals. Choose foods such as meat, fish, milk and other low fat dairy products, vegetables, fruits, and whole-grain breads and cereals. Your caregiver will tell you of the ideal weight gain.   A physical sexual relationship may be continued up until near the end of pregnancy if there are no other problems. Problems could include early (premature) leaking of amniotic fluid from the membranes, vaginal bleeding, abdominal pain, or other medical or pregnancy problems.   Exercise regularly if there are no restrictions. Check with your caregiver if you are unsure of the safety of some of your exercises. The greatest weight gain will occur in the   last 2 trimesters of pregnancy. Exercise will help you:   Control your weight.   Get you in shape for labor and delivery.   Lose weight after you have the baby.   Wear a good support or jogging bra for breast tenderness during pregnancy. This may help if worn during sleep. Pads or tissues may be used in the bra if you are leaking colostrum.   Do not use hot tubs, steam rooms or saunas throughout the pregnancy.   Wear your seat belt at all times when driving. This protects you and your baby if you are in an accident.   Avoid raw meat, uncooked cheese, cat litter boxes and soil used by cats. These carry germs that can cause birth defects in the baby.   The second trimester is also a good time to visit your dentist for your dental health if this has not been done yet. Getting your teeth cleaned is OK. Use a soft toothbrush. Brush gently during pregnancy.   It is easier to loose urine during pregnancy. Tightening up and strengthening the pelvic muscles will  help with this problem. Practice stopping your urination while you are going to the bathroom. These are the same muscles you need to strengthen. It is also the muscles you would use as if you were trying to stop from passing gas. You can practice tightening these muscles up 10 times a set and repeating this about 3 times per day. Once you know what muscles to tighten up, do not perform these exercises during urination. It is more likely to contribute to an infection by backing up the urine.   Ask for help if you have financial, counseling or nutritional needs during pregnancy. Your caregiver will be able to offer counseling for these needs as well as refer you for other special needs.   Your skin may become oily. If so, wash your face with mild soap, use non-greasy moisturizer and oil or cream based makeup.  MEDICATIONS AND DRUG USE IN PREGNANCY  Take prenatal vitamins as directed. The vitamin should contain 1 milligram of folic acid. Keep all vitamins out of reach of children. Only a couple vitamins or tablets containing iron may be fatal to a baby or young child when ingested.   Avoid use of all medications, including herbs, over-the-counter medications, not prescribed or suggested by your caregiver. Only take over-the-counter or prescription medicines for pain, discomfort, or fever as directed by your caregiver. Do not use aspirin.   Let your caregiver also know about herbs you may be using.   Alcohol is related to a number of birth defects. This includes fetal alcohol syndrome. All alcohol, in any form, should be avoided completely. Smoking will cause low birth rate and premature babies.   Street or illegal drugs are very harmful to the baby. They are absolutely forbidden. A baby born to an addicted mother will be addicted at birth. The baby will go through the same withdrawal an adult does.  SEEK MEDICAL CARE IF:  You have any concerns or worries during your pregnancy. It is better to call with  your questions if you feel they cannot wait, rather than worry about them. SEEK IMMEDIATE MEDICAL CARE IF:   An unexplained oral temperature above 102 F (38.9 C) develops, or as your caregiver suggests.   You have leaking of fluid from the vagina (birth canal). If leaking membranes are suspected, take your temperature and tell your caregiver of this when you call.   There   is vaginal spotting, bleeding, or passing clots. Tell your caregiver of the amount and how many pads are used. Light spotting in pregnancy is common, especially following intercourse.   You develop a bad smelling vaginal discharge with a change in the color from clear to white.   You continue to feel sick to your stomach (nauseated) and have no relief from remedies suggested. You vomit blood or coffee ground-like materials.   You lose more than 2 pounds of weight or gain more than 2 pounds of weight over 1 week, or as suggested by your caregiver.   You notice swelling of your face, hands, feet, or legs.   You get exposed to German measles and have never had them.   You are exposed to fifth disease or chickenpox.   You develop belly (abdominal) pain. Round ligament discomfort is a common non-cancerous (benign) cause of abdominal pain in pregnancy. Your caregiver still must evaluate you.   You develop a bad headache that does not go away.   You develop fever, diarrhea, pain with urination, or shortness of breath.   You develop visual problems, blurry, or double vision.   You fall or are in a car accident or any kind of trauma.   There is mental or physical violence at home.  Document Released: 08/29/2001 Document Revised: 08/24/2011 Document Reviewed: 03/03/2009 [employer] Patient Information 2012 [employer]. Breastfeeding BENEFITS OF BREASTFEEDING For the baby  The first milk (colostrum) helps the baby's digestive system function better.   There are antibodies from the mother in the milk that help the  baby fight off infections.   The baby has a lower incidence of asthma, allergies, and SIDS (sudden infant death syndrome).   The nutrients in breast milk are better than formulas for the baby and helps the baby's brain grow better.   Babies who breastfeed have less gas, colic, and constipation.  For the mother  Breastfeeding helps develop a very special bond between mother and baby.   It is more convenient, always available at the correct temperature and cheaper than formula feeding.   It burns calories in the mother and helps with losing weight that was gained during pregnancy.   It makes the uterus contract back down to normal size faster and slows bleeding following delivery.   Breastfeeding mothers have a lower risk of developing breast cancer.  NURSE FREQUENTLY  A healthy, full-term baby may breastfeed as often as every hour or space his or her feedings to every 3 hours.   How often to nurse will vary from baby to baby. Watch your baby for signs of hunger, not the clock.   Nurse as often as the baby requests, or when you feel the need to reduce the fullness of your breasts.   Awaken the baby if it has been 3 to 4 hours since the last feeding.   Frequent feeding will help the mother make more milk and will prevent problems like sore nipples and engorgement of the breasts.  BABY'S POSITION AT THE BREAST  Whether lying down or sitting, be sure that the baby's tummy is facing your tummy.   Support the breast with 4 fingers underneath the breast and the thumb above. Make sure your fingers are well away from the nipple and baby's mouth.   Stroke the baby's lips and cheek closest to the breast gently with your finger or nipple.   When the baby's mouth is open wide enough, place all of your nipple and as much   of the dark area around the nipple as possible into your baby's mouth.   Pull the baby in close so the tip of the nose and the baby's cheeks touch the breast during the  feeding.  FEEDINGS  The length of each feeding varies from baby to baby and from feeding to feeding.   The baby must suck about 2 to 3 minutes for your milk to get to him or her. This is called a "let down." For this reason, allow the baby to feed on each breast as long as he or she wants. Your baby will end the feeding when he or she has received the right balance of nutrients.   To break the suction, put your finger into the corner of the baby's mouth and slide it between his or her gums before removing your breast from his or her mouth. This will help prevent sore nipples.  REDUCING BREAST ENGORGEMENT  In the first week after your baby is born, you may experience signs of breast engorgement. When breasts are engorged, they feel heavy, warm, full, and may be tender to the touch. You can reduce engorgement if you:   Nurse frequently, every 2 to 3 hours. Mothers who breastfeed early and often have fewer problems with engorgement.   Place light ice packs on your breasts between feedings. This reduces swelling. Wrap the ice packs in a lightweight towel to protect your skin.   Apply moist hot packs to your breast for 5 to 10 minutes before each feeding. This increases circulation and helps the milk flow.   Gently massage your breast before and during the feeding.   Make sure that the baby empties at least one breast at every feeding before switching sides.   Use a breast pump to empty the breasts if your baby is sleepy or not nursing well. You may also want to pump if you are returning to work or or you feel you are getting engorged.   Avoid bottle feeds, pacifiers or supplemental feedings of water or juice in place of breastfeeding.   Be sure the baby is latched on and positioned properly while breastfeeding.   Prevent fatigue, stress, and anemia.   Wear a supportive bra, avoiding underwire styles.   Eat a balanced diet with enough fluids.  If you follow these suggestions, your  engorgement should improve in 24 to 48 hours. If you are still experiencing difficulty, call your lactation consultant or caregiver. IS MY BABY GETTING ENOUGH MILK? Sometimes, mothers worry about whether their babies are getting enough milk. You can be assured that your baby is getting enough milk if:  The baby is actively sucking and you hear swallowing.   The baby nurses at least 8 to 12 times in a 24 hour time period. Nurse your baby until he or she unlatches or falls asleep at the first breast (at least 10 to 20 minutes), then offer the second side.   The baby is wetting 5 to 6 disposable diapers (6 to 8 cloth diapers) in a 24 hour period by 5 to 6 days of age.   The baby is having at least 2 to 3 stools every 24 hours for the first few months. Breast milk is all the food your baby needs. It is not necessary for your baby to have water or formula. In fact, to help your breasts make more milk, it is best not to give your baby supplemental feedings during the early weeks.   The stool should   be soft and yellow.   The baby should gain 4 to 7 ounces per week after he is 4 days old.  TAKE CARE OF YOURSELF Take care of your breasts by:  Bathing or showering daily.   Avoiding the use of soaps on your nipples.   Start feedings on your left breast at one feeding and on your right breast at the next feeding.   You will notice an increase in your milk supply 2 to 5 days after delivery. You may feel some discomfort from engorgement, which makes your breasts very firm and often tender. Engorgement "peaks" out within 24 to 48 hours. In the meantime, apply warm moist towels to your breasts for 5 to 10 minutes before feeding. Gentle massage and expression of some milk before feeding will soften your breasts, making it easier for your baby to latch on. Wear a well fitting nursing bra and air dry your nipples for 10 to 15 minutes after each feeding.   Only use cotton bra pads.   Only use pure lanolin on  your nipples after nursing. You do not need to wash it off before nursing.  Take care of yourself by:   Eating well-balanced meals and nutritious snacks.   Drinking milk, fruit juice, and water to satisfy your thirst (about 8 glasses a day).   Getting plenty of rest.   Increasing calcium in your diet (1200 mg a day).   Avoiding foods that you notice affect the baby in a bad way.  SEEK MEDICAL CARE IF:   You have any questions or difficulty with breastfeeding.   You need help.   You have a hard, red, sore area on your breast, accompanied by a fever of 100.5 F (38.1 C) or more.   Your baby is too sleepy to eat well or is having trouble sleeping.   Your baby is wetting less than 6 diapers per day, by 5 days of age.   Your baby's skin or white part of his or her eyes is more yellow than it was in the hospital.   You feel depressed.  Document Released: 09/04/2005 Document Revised: 08/24/2011 Document Reviewed: 04/19/2009 [employer] Patient Information 2012 [employer]. 

## 2012-03-18 ENCOUNTER — Encounter: Payer: Managed Care, Other (non HMO) | Admitting: Obstetrics & Gynecology

## 2012-03-19 ENCOUNTER — Encounter: Payer: Managed Care, Other (non HMO) | Admitting: Family Medicine

## 2012-03-26 ENCOUNTER — Ambulatory Visit (INDEPENDENT_AMBULATORY_CARE_PROVIDER_SITE_OTHER): Payer: Managed Care, Other (non HMO) | Admitting: Family Medicine

## 2012-03-26 ENCOUNTER — Encounter: Payer: Self-pay | Admitting: Family Medicine

## 2012-03-26 VITALS — BP 108/70 | Wt 262.0 lb

## 2012-03-26 DIAGNOSIS — Z23 Encounter for immunization: Secondary | ICD-10-CM

## 2012-03-26 DIAGNOSIS — Z3480 Encounter for supervision of other normal pregnancy, unspecified trimester: Secondary | ICD-10-CM

## 2012-03-26 LAB — CBC
HCT: 32.3 % — ABNORMAL LOW (ref 36.0–46.0)
Hemoglobin: 10.9 g/dL — ABNORMAL LOW (ref 12.0–15.0)
RBC: 3.7 MIL/uL — ABNORMAL LOW (ref 3.87–5.11)
WBC: 7.9 10*3/uL (ref 4.0–10.5)

## 2012-03-26 MED ORDER — TETANUS-DIPHTH-ACELL PERTUSSIS 5-2.5-18.5 LF-MCG/0.5 IM SUSP
0.5000 mL | Freq: Once | INTRAMUSCULAR | Status: AC
  Administered 2012-03-26: 0.5 mL via INTRAMUSCULAR

## 2012-03-26 NOTE — Progress Notes (Signed)
TDaP and 28 wk labs today

## 2012-03-26 NOTE — Progress Notes (Signed)
Patient is here for routine prenatal check, she is doing well.

## 2012-03-26 NOTE — Patient Instructions (Addendum)
Pregnancy - Third Trimester The third trimester of pregnancy (the last 3 months) is a period of the most rapid growth for you and your baby. The baby approaches a length of 20 inches and a weight of 6 to 10 pounds. The baby is adding on fat and getting ready for life outside your body. While inside, babies have periods of sleeping and waking, suck their thumbs, and hiccups. You can often feel small contractions of the uterus. This is false labor. It is also called Braxton-Hicks contractions. This is like a practice for labor. The usual problems in this stage of pregnancy include more difficulty breathing, swelling of the hands and feet from water retention, and having to urinate more often because of the uterus and baby pressing on your bladder.  PRENATAL EXAMS  Blood work may continue to be done during prenatal exams. These tests are done to check on your health and the probable health of your baby. Blood work is used to follow your blood levels (hemoglobin). Anemia (low hemoglobin) is common during pregnancy. Iron and vitamins are given to help prevent this. You may also continue to be checked for diabetes. Some of the past blood tests may be done again.   The size of the uterus is measured during each visit. This makes sure your baby is growing properly according to your pregnancy dates.   Your blood pressure is checked every prenatal visit. This is to make sure you are not getting toxemia.   Your urine is checked every prenatal visit for infection, diabetes and protein.   Your weight is checked at each visit. This is done to make sure gains are happening at the suggested rate and that you and your baby are growing normally.   Sometimes, an ultrasound is performed to confirm the position and the proper growth and development of the baby. This is a test done that bounces harmless sound waves off the baby so your caregiver can more accurately determine due dates.   Discuss the type of pain  medication and anesthesia you will have during your labor and delivery.   Discuss the possibility and anesthesia if a Cesarean Section might be necessary.   Inform your caregiver if there is any mental or physical violence at home.  Sometimes, a specialized non-stress test, contraction stress test and biophysical profile are done to make sure the baby is not having a problem. Checking the amniotic fluid surrounding the baby is called an amniocentesis. The amniotic fluid is removed by sticking a needle into the belly (abdomen). This is sometimes done near the end of pregnancy if an early delivery is required. In this case, it is done to help make sure the baby's lungs are mature enough for the baby to live outside of the womb. If the lungs are not mature and it is unsafe to deliver the baby, an injection of cortisone medication is given to the mother 1 to 2 days before the delivery. This helps the baby's lungs mature and makes it safer to deliver the baby. CHANGES OCCURING IN THE THIRD TRIMESTER OF PREGNANCY Your body goes through many changes during pregnancy. They vary from person to person. Talk to your caregiver about changes you notice and are concerned about.  During the last trimester, you have probably had an increase in your appetite. It is normal to have cravings for certain foods. This varies from person to person and pregnancy to pregnancy.   You may begin to get stretch marks on your hips,   abdomen, and breasts. These are normal changes in the body during pregnancy. There are no exercises or medications to take which prevent this change.   Constipation may be treated with a stool softener or adding bulk to your diet. Drinking lots of fluids, fiber in vegetables, fruits, and whole grains are helpful.   Exercising is also helpful. If you have been very active up until your pregnancy, most of these activities can be continued during your pregnancy. If you have been less active, it is helpful  to start an exercise program such as walking. Consult your caregiver before starting exercise programs.   Avoid all smoking, alcohol, un-prescribed drugs, herbs and "street drugs" during your pregnancy. These chemicals affect the formation and growth of the baby. Avoid chemicals throughout the pregnancy to ensure the delivery of a healthy infant.   Backache, varicose veins and hemorrhoids may develop or get worse.   You will tire more easily in the third trimester, which is normal.   The baby's movements may be stronger and more often.   You may become short of breath easily.   Your belly button may stick out.   A yellow discharge may leak from your breasts called colostrum.   You may have a bloody mucus discharge. This usually occurs a few days to a week before labor begins.  HOME CARE INSTRUCTIONS   Keep your caregiver's appointments. Follow your caregiver's instructions regarding medication use, exercise, and diet.   During pregnancy, you are providing food for you and your baby. Continue to eat regular, well-balanced meals. Choose foods such as meat, fish, milk and other low fat dairy products, vegetables, fruits, and whole-grain breads and cereals. Your caregiver will tell you of the ideal weight gain.   A physical sexual relationship may be continued throughout pregnancy if there are no other problems such as early (premature) leaking of amniotic fluid from the membranes, vaginal bleeding, or belly (abdominal) pain.   Exercise regularly if there are no restrictions. Check with your caregiver if you are unsure of the safety of your exercises. Greater weight gain will occur in the last 2 trimesters of pregnancy. Exercising helps:   Control your weight.   Get you in shape for labor and delivery.   You lose weight after you deliver.   Rest a lot with legs elevated, or as needed for leg cramps or low back pain.   Wear a good support or jogging bra for breast tenderness during  pregnancy. This may help if worn during sleep. Pads or tissues may be used in the bra if you are leaking colostrum.   Do not use hot tubs, steam rooms, or saunas.   Wear your seat belt when driving. This protects you and your baby if you are in an accident.   Avoid raw meat, cat litter boxes and soil used by cats. These carry germs that can cause birth defects in the baby.   It is easier to loose urine during pregnancy. Tightening up and strengthening the pelvic muscles will help with this problem. You can practice stopping your urination while you are going to the bathroom. These are the same muscles you need to strengthen. It is also the muscles you would use if you were trying to stop from passing gas. You can practice tightening these muscles up 10 times a set and repeating this about 3 times per day. Once you know what muscles to tighten up, do not perform these exercises during urination. It is more likely   to cause an infection by backing up the urine.   Ask for help if you have financial, counseling or nutritional needs during pregnancy. Your caregiver will be able to offer counseling for these needs as well as refer you for other special needs.   Make a list of emergency phone numbers and have them available.   Plan on getting help from family or friends when you go home from the hospital.   Make a trial run to the hospital.   Take prenatal classes with the father to understand, practice and ask questions about the labor and delivery.   Prepare the baby's room/nursery.   Do not travel out of the city unless it is absolutely necessary and with the advice of your caregiver.   Wear only low or no heal shoes to have better balance and prevent falling.  MEDICATIONS AND DRUG USE IN PREGNANCY  Take prenatal vitamins as directed. The vitamin should contain 1 milligram of folic acid. Keep all vitamins out of reach of children. Only a couple vitamins or tablets containing iron may be fatal  to a baby or young child when ingested.   Avoid use of all medications, including herbs, over-the-counter medications, not prescribed or suggested by your caregiver. Only take over-the-counter or prescription medicines for pain, discomfort, or fever as directed by your caregiver. Do not use aspirin, ibuprofen (Motrin, Advil, Nuprin) or naproxen (Aleve) unless OK'd by your caregiver.   Let your caregiver also know about herbs you may be using.   Alcohol is related to a number of birth defects. This includes fetal alcohol syndrome. All alcohol, in any form, should be avoided completely. Smoking will cause low birth rate and premature babies.   Street/illegal drugs are very harmful to the baby. They are absolutely forbidden. A baby born to an addicted mother will be addicted at birth. The baby will go through the same withdrawal an adult does.  SEEK MEDICAL CARE IF: You have any concerns or worries during your pregnancy. It is better to call with your questions if you feel they cannot wait, rather than worry about them. DECISIONS ABOUT CIRCUMCISION You may or may not know the sex of your baby. If you know your baby is a boy, it may be time to think about circumcision. Circumcision is the removal of the foreskin of the penis. This is the skin that covers the sensitive end of the penis. There is no proven medical need for this. Often this decision is made on what is popular at the time or based upon religious beliefs and social issues. You can discuss these issues with your caregiver or pediatrician. SEEK IMMEDIATE MEDICAL CARE IF:   An unexplained oral temperature above 102 F (38.9 C) develops, or as your caregiver suggests.   You have leaking of fluid from the vagina (birth canal). If leaking membranes are suspected, take your temperature and tell your caregiver of this when you call.   There is vaginal spotting, bleeding or passing clots. Tell your caregiver of the amount and how many pads are  used.   You develop a bad smelling vaginal discharge with a change in the color from clear to white.   You develop vomiting that lasts more than 24 hours.   You develop chills or fever.   You develop shortness of breath.   You develop burning on urination.   You loose more than 2 pounds of weight or gain more than 2 pounds of weight or as suggested by your   caregiver.   You notice sudden swelling of your face, hands, and feet or legs.   You develop belly (abdominal) pain. Round ligament discomfort is a common non-cancerous (benign) cause of abdominal pain in pregnancy. Your caregiver still must evaluate you.   You develop a severe headache that does not go away.   You develop visual problems, blurred or double vision.   If you have not felt your baby move for more than 1 hour. If you think the baby is not moving as much as usual, eat something with sugar in it and lie down on your left side for an hour. The baby should move at least 4 to 5 times per hour. Call right away if your baby moves less than that.   You fall, are in a car accident or any kind of trauma.   There is mental or physical violence at home.  Document Released: 08/29/2001 Document Revised: 08/24/2011 Document Reviewed: 03/03/2009 [employer] Patient Information 2012 [employer]. Breastfeeding BENEFITS OF BREASTFEEDING For the baby  The first milk (colostrum) helps the baby's digestive system function better.   There are antibodies from the mother in the milk that help the baby fight off infections.   The baby has a lower incidence of asthma, allergies, and SIDS (sudden infant death syndrome).   The nutrients in breast milk are better than formulas for the baby and helps the baby's brain grow better.   Babies who breastfeed have less gas, colic, and constipation.  For the mother  Breastfeeding helps develop a very special bond between mother and baby.   It is more convenient, always available at the  correct temperature and cheaper than formula feeding.   It burns calories in the mother and helps with losing weight that was gained during pregnancy.   It makes the uterus contract back down to normal size faster and slows bleeding following delivery.   Breastfeeding mothers have a lower risk of developing breast cancer.  NURSE FREQUENTLY  A healthy, full-term baby may breastfeed as often as every hour or space his or her feedings to every 3 hours.   How often to nurse will vary from baby to baby. Watch your baby for signs of hunger, not the clock.   Nurse as often as the baby requests, or when you feel the need to reduce the fullness of your breasts.   Awaken the baby if it has been 3 to 4 hours since the last feeding.   Frequent feeding will help the mother make more milk and will prevent problems like sore nipples and engorgement of the breasts.  BABY'S POSITION AT THE BREAST  Whether lying down or sitting, be sure that the baby's tummy is facing your tummy.   Support the breast with 4 fingers underneath the breast and the thumb above. Make sure your fingers are well away from the nipple and baby's mouth.   Stroke the baby's lips and cheek closest to the breast gently with your finger or nipple.   When the baby's mouth is open wide enough, place all of your nipple and as much of the dark area around the nipple as possible into your baby's mouth.   Pull the baby in close so the tip of the nose and the baby's cheeks touch the breast during the feeding.  FEEDINGS  The length of each feeding varies from baby to baby and from feeding to feeding.   The baby must suck about 2 to 3 minutes for your milk   to get to him or her. This is called a "let down." For this reason, allow the baby to feed on each breast as long as he or she wants. Your baby will end the feeding when he or she has received the right balance of nutrients.   To break the suction, put your finger into the corner of the  baby's mouth and slide it between his or her gums before removing your breast from his or her mouth. This will help prevent sore nipples.  REDUCING BREAST ENGORGEMENT  In the first week after your baby is born, you may experience signs of breast engorgement. When breasts are engorged, they feel heavy, warm, full, and may be tender to the touch. You can reduce engorgement if you:   Nurse frequently, every 2 to 3 hours. Mothers who breastfeed early and often have fewer problems with engorgement.   Place light ice packs on your breasts between feedings. This reduces swelling. Wrap the ice packs in a lightweight towel to protect your skin.   Apply moist hot packs to your breast for 5 to 10 minutes before each feeding. This increases circulation and helps the milk flow.   Gently massage your breast before and during the feeding.   Make sure that the baby empties at least one breast at every feeding before switching sides.   Use a breast pump to empty the breasts if your baby is sleepy or not nursing well. You may also want to pump if you are returning to work or or you feel you are getting engorged.   Avoid bottle feeds, pacifiers or supplemental feedings of water or juice in place of breastfeeding.   Be sure the baby is latched on and positioned properly while breastfeeding.   Prevent fatigue, stress, and anemia.   Wear a supportive bra, avoiding underwire styles.   Eat a balanced diet with enough fluids.  If you follow these suggestions, your engorgement should improve in 24 to 48 hours. If you are still experiencing difficulty, call your lactation consultant or caregiver. IS MY BABY GETTING ENOUGH MILK? Sometimes, mothers worry about whether their babies are getting enough milk. You can be assured that your baby is getting enough milk if:  The baby is actively sucking and you hear swallowing.   The baby nurses at least 8 to 12 times in a 24 hour time period. Nurse your baby until he or  she unlatches or falls asleep at the first breast (at least 10 to 20 minutes), then offer the second side.   The baby is wetting 5 to 6 disposable diapers (6 to 8 cloth diapers) in a 24 hour period by 5 to 6 days of age.   The baby is having at least 2 to 3 stools every 24 hours for the first few months. Breast milk is all the food your baby needs. It is not necessary for your baby to have water or formula. In fact, to help your breasts make more milk, it is best not to give your baby supplemental feedings during the early weeks.   The stool should be soft and yellow.   The baby should gain 4 to 7 ounces per week after he is 4 days old.  TAKE CARE OF YOURSELF Take care of your breasts by:  Bathing or showering daily.   Avoiding the use of soaps on your nipples.   Start feedings on your left breast at one feeding and on your right breast at the next feeding.     You will notice an increase in your milk supply 2 to 5 days after delivery. You may feel some discomfort from engorgement, which makes your breasts very firm and often tender. Engorgement "peaks" out within 24 to 48 hours. In the meantime, apply warm moist towels to your breasts for 5 to 10 minutes before feeding. Gentle massage and expression of some milk before feeding will soften your breasts, making it easier for your baby to latch on. Wear a well fitting nursing bra and air dry your nipples for 10 to 15 minutes after each feeding.   Only use cotton bra pads.   Only use pure lanolin on your nipples after nursing. You do not need to wash it off before nursing.  Take care of yourself by:   Eating well-balanced meals and nutritious snacks.   Drinking milk, fruit juice, and water to satisfy your thirst (about 8 glasses a day).   Getting plenty of rest.   Increasing calcium in your diet (1200 mg a day).   Avoiding foods that you notice affect the baby in a bad way.  SEEK MEDICAL CARE IF:   You have any questions or difficulty  with breastfeeding.   You need help.   You have a hard, red, sore area on your breast, accompanied by a fever of 100.5 F (38.1 C) or more.   Your baby is too sleepy to eat well or is having trouble sleeping.   Your baby is wetting less than 6 diapers per day, by 70 days of age.   Your baby's skin or white part of his or her eyes is more yellow than it was in the hospital.   You feel depressed.  Document Released: 09/04/2005 Document Revised: 08/24/2011 Document Reviewed: 04/19/2009 [facility] Patient Information 2012 [place], [place]. Levonorgestrel intrauterine device (IUD) What is this medicine? LEVONORGESTREL IUD (LEE voe nor jes trel) is a contraceptive (birth control) device. It is used to prevent pregnancy and to treat heavy bleeding that occurs during your period. It can be used for up to 5 years. This medicine may be used for other purposes; ask your health care provider or pharmacist if you have questions. What should I tell my health care provider before I take this medicine? They need to know if you have any of these conditions: -abnormal Pap smear -cancer of the breast, uterus, or cervix -diabetes -endometritis -genital or pelvic infection now or in the past -have more than one sexual partner or your partner has more than one partner -heart disease -history of an ectopic or tubal pregnancy -immune system problems -IUD in place -liver disease or tumor -problems with blood clots or take blood-thinners -use intravenous drugs -uterus of unusual shape -vaginal bleeding that has not been explained -an unusual or allergic reaction to levonorgestrel, other hormones, silicone, or polyethylene, medicines, foods, dyes, or preservatives -pregnant or trying to get pregnant -breast-feeding How should I use this medicine? This device is placed inside the uterus by a health care professional. Talk to your pediatrician regarding the use of this medicine in children. Special care may  be needed. Overdosage: If you think you have taken too much of this medicine contact a poison control center or emergency room at once. NOTE: This medicine is only for you. Do not share this medicine with others. What if I miss a dose? This does not apply. What may interact with this medicine? Do not take this medicine with any of the following medications: -amprenavir -bosentan -fosamprenavir This medicine may also  interact with the following medications: -aprepitant -barbiturate medicines for inducing sleep or treating seizures -bexarotene -griseofulvin -medicines to treat seizures like carbamazepine, ethotoin, felbamate, oxcarbazepine, phenytoin, topiramate -modafinil -pioglitazone -rifabutin -rifampin -rifapentine -some medicines to treat HIV infection like atazanavir, indinavir, lopinavir, nelfinavir, tipranavir, ritonavir -St. John's wort -warfarin This list may not describe all possible interactions. Give your health care provider a list of all the medicines, herbs, non-prescription drugs, or dietary supplements you use. Also tell them if you smoke, drink alcohol, or use illegal drugs. Some items may interact with your medicine. What should I watch for while using this medicine? Visit your doctor or health care professional for regular check ups. See your doctor if you or your partner has sexual contact with others, becomes HIV positive, or gets a sexual transmitted disease. This product does not protect you against HIV infection (AIDS) or other sexually transmitted diseases. You can check the placement of the IUD yourself by reaching up to the top of your vagina with clean fingers to feel the threads. Do not pull on the threads. It is a good habit to check placement after each menstrual period. Call your doctor right away if you feel more of the IUD than just the threads or if you cannot feel the threads at all. The IUD may come out by itself. You may become pregnant if the  device comes out. If you notice that the IUD has come out use a backup birth control method like condoms and call your health care provider. Using tampons will not change the position of the IUD and are okay to use during your period. What side effects may I notice from receiving this medicine? Side effects that you should report to your doctor or health care professional as soon as possible: -allergic reactions like skin rash, itching or hives, swelling of the face, lips, or tongue -fever, flu-like symptoms -genital sores -high blood pressure -no menstrual period for 6 weeks during use -pain, swelling, warmth in the leg -pelvic pain or tenderness -severe or sudden headache -signs of pregnancy -stomach cramping -sudden shortness of breath -trouble with balance, talking, or walking -unusual vaginal bleeding, discharge -yellowing of the eyes or skin Side effects that usually do not require medical attention (report to your doctor or health care professional if they continue or are bothersome): -acne -breast pain -change in sex drive or performance -changes in weight -cramping, dizziness, or faintness while the device is being inserted -headache -irregular menstrual bleeding within first 3 to 6 months of use -nausea This list may not describe all possible side effects. Call your doctor for medical advice about side effects. You may report side effects to FDA at 1-800-FDA-1088. Where should I keep my medicine? This does not apply. NOTE: This sheet is a summary. It may not cover all possible information. If you have questions about this medicine, talk to your doctor, pharmacist, or health care provider.  2012, [employer]. (09/25/2008 6:39:08 PM)

## 2012-03-27 LAB — RPR

## 2012-03-29 LAB — GLUCOSE TOLERANCE, 1 HOUR (50G) W/O FASTING: Glucose, 1 Hour GTT: 108 mg/dL (ref 70–140)

## 2012-03-30 ENCOUNTER — Encounter: Payer: Self-pay | Admitting: Family Medicine

## 2012-04-17 ENCOUNTER — Ambulatory Visit (INDEPENDENT_AMBULATORY_CARE_PROVIDER_SITE_OTHER): Payer: Managed Care, Other (non HMO) | Admitting: Family Medicine

## 2012-04-17 ENCOUNTER — Encounter: Payer: Self-pay | Admitting: Family Medicine

## 2012-04-17 VITALS — BP 112/68 | Wt 261.0 lb

## 2012-04-17 DIAGNOSIS — Z3490 Encounter for supervision of normal pregnancy, unspecified, unspecified trimester: Secondary | ICD-10-CM

## 2012-04-17 DIAGNOSIS — Z3480 Encounter for supervision of other normal pregnancy, unspecified trimester: Secondary | ICD-10-CM

## 2012-04-17 MED ORDER — CLOTRIMAZOLE-BETAMETHASONE 1-0.05 % EX CREA: TOPICAL_CREAM | Freq: Two times a day (BID) | CUTANEOUS | Status: AC

## 2012-04-17 NOTE — Progress Notes (Signed)
Doing well--1 hour is WNL-Rash is raised, with hyperpigmented middle and raised edges with some erythema-looks like ring worm vs. Eczema--will treat with Lotrisone-which should take care of either.

## 2012-04-17 NOTE — Patient Instructions (Signed)

## 2012-04-17 NOTE — Progress Notes (Signed)
Routine prenatal check, doing well.  Starting to feel increased pelvic pressure.  She has a circular rash on her finger that has been there for one week.

## 2012-05-01 ENCOUNTER — Encounter: Payer: Managed Care, Other (non HMO) | Admitting: Obstetrics & Gynecology

## 2012-05-07 ENCOUNTER — Ambulatory Visit (INDEPENDENT_AMBULATORY_CARE_PROVIDER_SITE_OTHER): Payer: Managed Care, Other (non HMO) | Admitting: Family Medicine

## 2012-05-07 ENCOUNTER — Encounter: Payer: Self-pay | Admitting: Family Medicine

## 2012-05-07 VITALS — BP 115/71 | Wt 267.0 lb

## 2012-05-07 DIAGNOSIS — Z3480 Encounter for supervision of other normal pregnancy, unspecified trimester: Secondary | ICD-10-CM

## 2012-05-07 DIAGNOSIS — R809 Proteinuria, unspecified: Secondary | ICD-10-CM

## 2012-05-07 NOTE — Patient Instructions (Addendum)
Pregnancy - Third Trimester The third trimester of pregnancy (the last 3 months) is a period of the most rapid growth for you and your baby. The baby approaches a length of 20 inches and a weight of 6 to 10 pounds. The baby is adding on fat and getting ready for life outside your body. While inside, babies have periods of sleeping and waking, suck their thumbs, and hiccups. You can often feel small contractions of the uterus. This is false labor. It is also called Braxton-Hicks contractions. This is like a practice for labor. The usual problems in this stage of pregnancy include more difficulty breathing, swelling of the hands and feet from water retention, and having to urinate more often because of the uterus and baby pressing on your bladder.  PRENATAL EXAMS  Blood work may continue to be done during prenatal exams. These tests are done to check on your health and the probable health of your baby. Blood work is used to follow your blood levels (hemoglobin). Anemia (low hemoglobin) is common during pregnancy. Iron and vitamins are given to help prevent this. You may also continue to be checked for diabetes. Some of the past blood tests may be done again.   The size of the uterus is measured during each visit. This makes sure your baby is growing properly according to your pregnancy dates.   Your blood pressure is checked every prenatal visit. This is to make sure you are not getting toxemia.   Your urine is checked every prenatal visit for infection, diabetes and protein.   Your weight is checked at each visit. This is done to make sure gains are happening at the suggested rate and that you and your baby are growing normally.   Sometimes, an ultrasound is performed to confirm the position and the proper growth and development of the baby. This is a test done that bounces harmless sound waves off the baby so your caregiver can more accurately determine due dates.   Discuss the type of pain  medication and anesthesia you will have during your labor and delivery.   Discuss the possibility and anesthesia if a Cesarean Section might be necessary.   Inform your caregiver if there is any mental or physical violence at home.  Sometimes, a specialized non-stress test, contraction stress test and biophysical profile are done to make sure the baby is not having a problem. Checking the amniotic fluid surrounding the baby is called an amniocentesis. The amniotic fluid is removed by sticking a needle into the belly (abdomen). This is sometimes done near the end of pregnancy if an early delivery is required. In this case, it is done to help make sure the baby's lungs are mature enough for the baby to live outside of the womb. If the lungs are not mature and it is unsafe to deliver the baby, an injection of cortisone medication is given to the mother 1 to 2 days before the delivery. This helps the baby's lungs mature and makes it safer to deliver the baby. CHANGES OCCURING IN THE THIRD TRIMESTER OF PREGNANCY Your body goes through many changes during pregnancy. They vary from person to person. Talk to your caregiver about changes you notice and are concerned about.  During the last trimester, you have probably had an increase in your appetite. It is normal to have cravings for certain foods. This varies from person to person and pregnancy to pregnancy.   You may begin to get stretch marks on your hips,   abdomen, and breasts. These are normal changes in the body during pregnancy. There are no exercises or medications to take which prevent this change.   Constipation may be treated with a stool softener or adding bulk to your diet. Drinking lots of fluids, fiber in vegetables, fruits, and whole grains are helpful.   Exercising is also helpful. If you have been very active up until your pregnancy, most of these activities can be continued during your pregnancy. If you have been less active, it is helpful  to start an exercise program such as walking. Consult your caregiver before starting exercise programs.   Avoid all smoking, alcohol, un-prescribed drugs, herbs and "street drugs" during your pregnancy. These chemicals affect the formation and growth of the baby. Avoid chemicals throughout the pregnancy to ensure the delivery of a healthy infant.   Backache, varicose veins and hemorrhoids may develop or get worse.   You will tire more easily in the third trimester, which is normal.   The baby's movements may be stronger and more often.   You may become short of breath easily.   Your belly button may stick out.   A yellow discharge may leak from your breasts called colostrum.   You may have a bloody mucus discharge. This usually occurs a few days to a week before labor begins.  HOME CARE INSTRUCTIONS   Keep your caregiver's appointments. Follow your caregiver's instructions regarding medication use, exercise, and diet.   During pregnancy, you are providing food for you and your baby. Continue to eat regular, well-balanced meals. Choose foods such as meat, fish, milk and other low fat dairy products, vegetables, fruits, and whole-grain breads and cereals. Your caregiver will tell you of the ideal weight gain.   A physical sexual relationship may be continued throughout pregnancy if there are no other problems such as early (premature) leaking of amniotic fluid from the membranes, vaginal bleeding, or belly (abdominal) pain.   Exercise regularly if there are no restrictions. Check with your caregiver if you are unsure of the safety of your exercises. Greater weight gain will occur in the last 2 trimesters of pregnancy. Exercising helps:   Control your weight.   Get you in shape for labor and delivery.   You lose weight after you deliver.   Rest a lot with legs elevated, or as needed for leg cramps or low back pain.   Wear a good support or jogging bra for breast tenderness during  pregnancy. This may help if worn during sleep. Pads or tissues may be used in the bra if you are leaking colostrum.   Do not use hot tubs, steam rooms, or saunas.   Wear your seat belt when driving. This protects you and your baby if you are in an accident.   Avoid raw meat, cat litter boxes and soil used by cats. These carry germs that can cause birth defects in the baby.   It is easier to loose urine during pregnancy. Tightening up and strengthening the pelvic muscles will help with this problem. You can practice stopping your urination while you are going to the bathroom. These are the same muscles you need to strengthen. It is also the muscles you would use if you were trying to stop from passing gas. You can practice tightening these muscles up 10 times a set and repeating this about 3 times per day. Once you know what muscles to tighten up, do not perform these exercises during urination. It is more likely   to cause an infection by backing up the urine.   Ask for help if you have financial, counseling or nutritional needs during pregnancy. Your caregiver will be able to offer counseling for these needs as well as refer you for other special needs.   Make a list of emergency phone numbers and have them available.   Plan on getting help from family or friends when you go home from the hospital.   Make a trial run to the hospital.   Take prenatal classes with the father to understand, practice and ask questions about the labor and delivery.   Prepare the baby's room/nursery.   Do not travel out of the city unless it is absolutely necessary and with the advice of your caregiver.   Wear only low or no heal shoes to have better balance and prevent falling.  MEDICATIONS AND DRUG USE IN PREGNANCY  Take prenatal vitamins as directed. The vitamin should contain 1 milligram of folic acid. Keep all vitamins out of reach of children. Only a couple vitamins or tablets containing iron may be fatal  to a baby or young child when ingested.   Avoid use of all medications, including herbs, over-the-counter medications, not prescribed or suggested by your caregiver. Only take over-the-counter or prescription medicines for pain, discomfort, or fever as directed by your caregiver. Do not use aspirin, ibuprofen (Motrin, Advil, Nuprin) or naproxen (Aleve) unless OK'd by your caregiver.   Let your caregiver also know about herbs you may be using.   Alcohol is related to a number of birth defects. This includes fetal alcohol syndrome. All alcohol, in any form, should be avoided completely. Smoking will cause low birth rate and premature babies.   Street/illegal drugs are very harmful to the baby. They are absolutely forbidden. A baby born to an addicted mother will be addicted at birth. The baby will go through the same withdrawal an adult does.  SEEK MEDICAL CARE IF: You have any concerns or worries during your pregnancy. It is better to call with your questions if you feel they cannot wait, rather than worry about them. DECISIONS ABOUT CIRCUMCISION You may or may not know the sex of your baby. If you know your baby is a boy, it may be time to think about circumcision. Circumcision is the removal of the foreskin of the penis. This is the skin that covers the sensitive end of the penis. There is no proven medical need for this. Often this decision is made on what is popular at the time or based upon religious beliefs and social issues. You can discuss these issues with your caregiver or pediatrician. SEEK IMMEDIATE MEDICAL CARE IF:   An unexplained oral temperature above 102 F (38.9 C) develops, or as your caregiver suggests.   You have leaking of fluid from the vagina (birth canal). If leaking membranes are suspected, take your temperature and tell your caregiver of this when you call.   There is vaginal spotting, bleeding or passing clots. Tell your caregiver of the amount and how many pads are  used.   You develop a bad smelling vaginal discharge with a change in the color from clear to white.   You develop vomiting that lasts more than 24 hours.   You develop chills or fever.   You develop shortness of breath.   You develop burning on urination.   You loose more than 2 pounds of weight or gain more than 2 pounds of weight or as suggested by your   caregiver.   You notice sudden swelling of your face, hands, and feet or legs.   You develop belly (abdominal) pain. Round ligament discomfort is a common non-cancerous (benign) cause of abdominal pain in pregnancy. Your caregiver still must evaluate you.   You develop a severe headache that does not go away.   You develop visual problems, blurred or double vision.   If you have not felt your baby move for more than 1 hour. If you think the baby is not moving as much as usual, eat something with sugar in it and lie down on your left side for an hour. The baby should move at least 4 to 5 times per hour. Call right away if your baby moves less than that.   You fall, are in a car accident or any kind of trauma.   There is mental or physical violence at home.  Document Released: 08/29/2001 Document Revised: 08/24/2011 Document Reviewed: 03/03/2009 [employer] Patient Information 2012 [employer]. Contraception Choices Contraception (birth control) is the use of any methods or devices to prevent pregnancy. Below are some methods to help avoid pregnancy. HORMONAL METHODS   Contraceptive implant. This is a thin, plastic tube containing progesterone hormone. It does not contain estrogen hormone. Your caregiver inserts the tube in the inner part of the upper arm. The tube can remain in place for up to 3 years. After 3 years, the implant must be removed. The implant prevents the ovaries from releasing an egg (ovulation), thickens the cervical mucus which prevents sperm from entering the uterus, and thins the lining of the inside of the  uterus.   Progesterone-only injections. These injections are given every 3 months by your caregiver to prevent pregnancy. This synthetic progesterone hormone stops the ovaries from releasing eggs. It also thickens cervical mucus and changes the uterine lining. This makes it harder for sperm to survive in the uterus.   Birth control pills. These pills contain estrogen and progesterone hormone. They work by stopping the egg from forming in the ovary (ovulation). Birth control pills are prescribed by a caregiver.Birth control pills can also be used to treat heavy periods.   Minipill. This type of birth control pill contains only the progesterone hormone. They are taken every day of each month and must be prescribed by your caregiver.   Birth control patch. The patch contains hormones similar to those in birth control pills. It must be changed once a week and is prescribed by a caregiver.   Vaginal ring. The ring contains hormones similar to those in birth control pills. It is left in the vagina for 3 weeks, removed for 1 week, and then a new one is put back in place. The patient must be comfortable inserting and removing the ring from the vagina.A caregiver's prescription is necessary.   Emergency contraception. Emergency contraceptives prevent pregnancy after unprotected sexual intercourse. This pill can be taken right after sex or up to 5 days after unprotected sex. It is most effective the sooner you take the pills after having sexual intercourse. Emergency contraceptive pills are available without a prescription. Check with your pharmacist. Do not use emergency contraception as your only form of birth control.  BARRIER METHODS   Female condom. This is a thin sheath (latex or rubber) that is worn over the penis during sexual intercourse. It can be used with spermicide to increase effectiveness.   Female condom. This is a soft, loose-fitting sheath that is put into the vagina before sexual  intercourse.     Diaphragm. This is a soft, latex, dome-shaped barrier that must be fitted by a caregiver. It is inserted into the vagina, along with a spermicidal jelly. It is inserted before intercourse. The diaphragm should be left in the vagina for 6 to 8 hours after intercourse.   Cervical cap. This is a round, soft, latex or plastic cup that fits over the cervix and must be fitted by a caregiver. The cap can be left in place for up to 48 hours after intercourse.   Sponge. This is a soft, circular piece of polyurethane foam. The sponge has spermicide in it. It is inserted into the vagina after wetting it and before sexual intercourse.   Spermicides. These are chemicals that kill or block sperm from entering the cervix and uterus. They come in the form of creams, jellies, suppositories, foam, or tablets. They do not require a prescription. They are inserted into the vagina with an applicator before having sexual intercourse. The process must be repeated every time you have sexual intercourse.  INTRAUTERINE CONTRACEPTION  Intrauterine device (IUD). This is a T-shaped device that is put in a woman's uterus during a menstrual period to prevent pregnancy. There are 2 types:   Copper IUD. This type of IUD is wrapped in copper wire and is placed inside the uterus. Copper makes the uterus and fallopian tubes produce a fluid that kills sperm. It can stay in place for 10 years.   Hormone IUD. This type of IUD contains the hormone progestin (synthetic progesterone). The hormone thickens the cervical mucus and prevents sperm from entering the uterus, and it also thins the uterine lining to prevent implantation of a fertilized egg. The hormone can weaken or kill the sperm that get into the uterus. It can stay in place for 5 years.  PERMANENT METHODS OF CONTRACEPTION  Female tubal ligation. This is when the woman's fallopian tubes are surgically sealed, tied, or blocked to prevent the egg from traveling to  the uterus.   Female sterilization. This is when the female has the tubes that carry sperm tied off (vasectomy).This blocks sperm from entering the vagina during sexual intercourse. After the procedure, the man can still ejaculate fluid (semen).  NATURAL PLANNING METHODS  Natural family planning. This is not having sexual intercourse or using a barrier method (condom, diaphragm, cervical cap) on days the woman could become pregnant.   Calendar method. This is keeping track of the length of each menstrual cycle and identifying when you are fertile.   Ovulation method. This is avoiding sexual intercourse during ovulation.   Symptothermal method. This is avoiding sexual intercourse during ovulation, using a thermometer and ovulation symptoms.   Post-ovulation method. This is timing sexual intercourse after you have ovulated.  Regardless of which type or method of contraception you choose, it is important that you use condoms to protect against the transmission of sexually transmitted diseases (STDs). Talk with your caregiver about which form of contraception is most appropriate for you. Document Released: 09/04/2005 Document Revised: 08/24/2011 Document Reviewed: 01/11/2011 [employer] Patient Information 2012 [employer]. Breastfeeding BENEFITS OF BREASTFEEDING For the baby  The first milk (colostrum) helps the baby's digestive system function better.   There are antibodies from the mother in the milk that help the baby fight off infections.   The baby has a lower incidence of asthma, allergies, and SIDS (sudden infant death syndrome).   The nutrients in breast milk are better than formulas for the baby and helps the baby's brain grow better.     Babies who breastfeed have less gas, colic, and constipation.  For the mother  Breastfeeding helps develop a very special bond between mother and baby.   It is more convenient, always available at the correct temperature and cheaper than formula  feeding.   It burns calories in the mother and helps with losing weight that was gained during pregnancy.   It makes the uterus contract back down to normal size faster and slows bleeding following delivery.   Breastfeeding mothers have a lower risk of developing breast cancer.  NURSE FREQUENTLY  A healthy, full-term baby may breastfeed as often as every hour or space his or her feedings to every 3 hours.   How often to nurse will vary from baby to baby. Watch your baby for signs of hunger, not the clock.   Nurse as often as the baby requests, or when you feel the need to reduce the fullness of your breasts.   Awaken the baby if it has been 3 to 4 hours since the last feeding.   Frequent feeding will help the mother make more milk and will prevent problems like sore nipples and engorgement of the breasts.  BABY'S POSITION AT THE BREAST  Whether lying down or sitting, be sure that the baby's tummy is facing your tummy.   Support the breast with 4 fingers underneath the breast and the thumb above. Make sure your fingers are well away from the nipple and baby's mouth.   Stroke the baby's lips and cheek closest to the breast gently with your finger or nipple.   When the baby's mouth is open wide enough, place all of your nipple and as much of the dark area around the nipple as possible into your baby's mouth.   Pull the baby in close so the tip of the nose and the baby's cheeks touch the breast during the feeding.  FEEDINGS  The length of each feeding varies from baby to baby and from feeding to feeding.   The baby must suck about 2 to 3 minutes for your milk to get to him or her. This is called a "let down." For this reason, allow the baby to feed on each breast as long as he or she wants. Your baby will end the feeding when he or she has received the right balance of nutrients.   To break the suction, put your finger into the corner of the baby's mouth and slide it between his or her  gums before removing your breast from his or her mouth. This will help prevent sore nipples.  REDUCING BREAST ENGORGEMENT  In the first week after your baby is born, you may experience signs of breast engorgement. When breasts are engorged, they feel heavy, warm, full, and may be tender to the touch. You can reduce engorgement if you:   Nurse frequently, every 2 to 3 hours. Mothers who breastfeed early and often have fewer problems with engorgement.   Place light ice packs on your breasts between feedings. This reduces swelling. Wrap the ice packs in a lightweight towel to protect your skin.   Apply moist hot packs to your breast for 5 to 10 minutes before each feeding. This increases circulation and helps the milk flow.   Gently massage your breast before and during the feeding.   Make sure that the baby empties at least one breast at every feeding before switching sides.   Use a breast pump to empty the breasts if your baby is sleepy or   not nursing well. You may also want to pump if you are returning to work or or you feel you are getting engorged.   Avoid bottle feeds, pacifiers or supplemental feedings of water or juice in place of breastfeeding.   Be sure the baby is latched on and positioned properly while breastfeeding.   Prevent fatigue, stress, and anemia.   Wear a supportive bra, avoiding underwire styles.   Eat a balanced diet with enough fluids.  If you follow these suggestions, your engorgement should improve in 24 to 48 hours. If you are still experiencing difficulty, call your lactation consultant or caregiver. IS MY BABY GETTING ENOUGH MILK? Sometimes, mothers worry about whether their babies are getting enough milk. You can be assured that your baby is getting enough milk if:  The baby is actively sucking and you hear swallowing.   The baby nurses at least 8 to 12 times in a 24 hour time period. Nurse your baby until he or she unlatches or falls asleep at the first  breast (at least 10 to 20 minutes), then offer the second side.   The baby is wetting 5 to 6 disposable diapers (6 to 8 cloth diapers) in a 24 hour period by 5 to 6 days of age.   The baby is having at least 2 to 3 stools every 24 hours for the first few months. Breast milk is all the food your baby needs. It is not necessary for your baby to have water or formula. In fact, to help your breasts make more milk, it is best not to give your baby supplemental feedings during the early weeks.   The stool should be soft and yellow.   The baby should gain 4 to 7 ounces per week after he is 4 days old.  TAKE CARE OF YOURSELF Take care of your breasts by:  Bathing or showering daily.   Avoiding the use of soaps on your nipples.   Start feedings on your left breast at one feeding and on your right breast at the next feeding.   You will notice an increase in your milk supply 2 to 5 days after delivery. You may feel some discomfort from engorgement, which makes your breasts very firm and often tender. Engorgement "peaks" out within 24 to 48 hours. In the meantime, apply warm moist towels to your breasts for 5 to 10 minutes before feeding. Gentle massage and expression of some milk before feeding will soften your breasts, making it easier for your baby to latch on. Wear a well fitting nursing bra and air dry your nipples for 10 to 15 minutes after each feeding.   Only use cotton bra pads.   Only use pure lanolin on your nipples after nursing. You do not need to wash it off before nursing.  Take care of yourself by:   Eating well-balanced meals and nutritious snacks.   Drinking milk, fruit juice, and water to satisfy your thirst (about 8 glasses a day).   Getting plenty of rest.   Increasing calcium in your diet (1200 mg a day).   Avoiding foods that you notice affect the baby in a bad way.  SEEK MEDICAL CARE IF:   You have any questions or difficulty with breastfeeding.   You need help.    You have a hard, red, sore area on your breast, accompanied by a fever of 100.5 F (38.1 C) or more.   Your baby is too sleepy to eat well or is having   trouble sleeping.   Your baby is wetting less than 6 diapers per day, by 5 days of age.   Your baby's skin or white part of his or her eyes is more yellow than it was in the hospital.   You feel depressed.  Document Released: 09/04/2005 Document Revised: 08/24/2011 Document Reviewed: 04/19/2009 [employer] Patient Information 2012 [employer]. Normal Labor and Delivery Your caregiver must first be sure you are in labor. Signs of labor include:  You may pass what is called "the mucus plug" before labor begins. This is a small amount of blood stained mucus.   Regular uterine contractions.   The time between contractions get closer together.   The discomfort and pain gradually gets more intense.   Pains are mostly located in the back.   Pains get worse when walking.   The cervix (the opening of the uterus becomes thinner (begins to efface) and opens up (dilates).  Once you are in labor and admitted into the hospital or care center, your caregiver will do the following:  A complete physical examination.   Check your vital signs (blood pressure, pulse, temperature and the fetal heart rate).   Do a vaginal examination (using a sterile glove and lubricant) to determine:   The position (presentation) of the baby (head [vertex] or buttock first).   The level (station) of the baby's head in the birth canal.   The effacement and dilatation of the cervix.   You may have your pubic hair shaved and be given an enema depending on your caregiver and the circumstance.   An electronic monitor is usually placed on your abdomen. The monitor follows the length and intensity of the contractions, as well as the baby's heart rate.   Usually, your caregiver will insert an IV in your arm with a bottle of sugar water. This is done as a  precaution so that medications can be given to you quickly during labor or delivery.  NORMAL LABOR AND DELIVERY IS DIVIDED UP INTO 3 STAGES: First Stage This is when regular contractions begin and the cervix begins to efface and dilate. This stage can last from 3 to 15 hours. The end of the first stage is when the cervix is 100% effaced and 10 centimeters dilated. Pain medications may be given by   Injection (morphine, demerol, etc.)   Regional anesthesia (spinal, caudal or epidural, anesthetics given in different locations of the spine). Paracervical pain medication may be given, which is an injection of and anesthetic on each side of the cervix.  A pregnant woman may request to have "Natural Childbirth" which is not to have any medications or anesthesia during her labor and delivery. Second Stage This is when the baby comes down through the birth canal (vagina) and is born. This can take 1 to 4 hours. As the baby's head comes down through the birth canal, you may feel like you are going to have a bowel movement. You will get the urge to bear down and push until the baby is delivered. As the baby's head is being delivered, the caregiver will decide if an episiotomy (a cut in the perineum and vagina area) is needed to prevent tearing of the tissue in this area. The episiotomy is sewn up after the delivery of the baby and placenta. Sometimes a mask with nitrous oxide is given for the mother to breath during the delivery of the baby to help if there is too much pain. The end of Stage 2 is   when the baby is fully delivered. Then when the umbilical cord stops pulsating it is clamped and cut. Third Stage The third stage begins after the baby is completely delivered and ends after the placenta (afterbirth) is delivered. This usually takes 5 to 30 minutes. After the placenta is delivered, a medication is given either by intravenous or injection to help contract the uterus and prevent bleeding. The third stage is  not painful and pain medication is usually not necessary. If an episiotomy was done, it is repaired at this time. After the delivery, the mother is watched and monitored closely for 1 to 2 hours to make sure there is no postpartum bleeding (hemorrhage). If there is a lot of bleeding, medication is given to contract the uterus and stop the bleeding. Document Released: 06/13/2008 Document Revised: 08/24/2011 Document Reviewed: 06/13/2008 ExitCare Patient Information 2012 [employer]. 

## 2012-05-07 NOTE — Progress Notes (Signed)
Rash is improved.  Lost mucous plug.  Will culture urine for prot.

## 2012-05-09 LAB — CULTURE, OB URINE

## 2012-05-21 ENCOUNTER — Encounter: Payer: Self-pay | Admitting: Obstetrics & Gynecology

## 2012-05-21 ENCOUNTER — Ambulatory Visit (INDEPENDENT_AMBULATORY_CARE_PROVIDER_SITE_OTHER): Payer: Managed Care, Other (non HMO) | Admitting: Obstetrics & Gynecology

## 2012-05-21 VITALS — BP 126/76 | Wt 273.0 lb

## 2012-05-21 DIAGNOSIS — E669 Obesity, unspecified: Secondary | ICD-10-CM

## 2012-05-21 DIAGNOSIS — Z3490 Encounter for supervision of normal pregnancy, unspecified, unspecified trimester: Secondary | ICD-10-CM

## 2012-05-21 DIAGNOSIS — Z3480 Encounter for supervision of other normal pregnancy, unspecified trimester: Secondary | ICD-10-CM

## 2012-05-21 DIAGNOSIS — O99210 Obesity complicating pregnancy, unspecified trimester: Secondary | ICD-10-CM

## 2012-05-21 NOTE — Addendum Note (Signed)
Addended by: Vinnie Langton C on: 05/21/2012 04:55 PM   Modules accepted: Orders

## 2012-05-21 NOTE — Progress Notes (Signed)
Routine visit. Cervical cultures today. Labor precautions. Good FM. She c/o vaginal irritation. I also sent a wet prep.

## 2012-05-22 ENCOUNTER — Encounter: Payer: Managed Care, Other (non HMO) | Admitting: Family Medicine

## 2012-05-22 LAB — GC/CHLAMYDIA PROBE AMP, GENITAL: Chlamydia, DNA Probe: NEGATIVE

## 2012-05-23 ENCOUNTER — Telehealth: Payer: Self-pay

## 2012-05-23 NOTE — Telephone Encounter (Signed)
Called in some Metronidazole 500 mg for this patient her wet prep showed a few clue cells and patient was having some symptoms of BV. Called it in to her [facility] in [place] PH:[phone].

## 2012-05-27 ENCOUNTER — Encounter: Payer: Managed Care, Other (non HMO) | Admitting: Obstetrics & Gynecology

## 2012-05-29 LAB — GENITAL CULTURE

## 2012-05-31 ENCOUNTER — Encounter: Payer: Self-pay | Admitting: Family Medicine

## 2012-05-31 ENCOUNTER — Ambulatory Visit (INDEPENDENT_AMBULATORY_CARE_PROVIDER_SITE_OTHER): Payer: Managed Care, Other (non HMO) | Admitting: Family Medicine

## 2012-05-31 DIAGNOSIS — Z3480 Encounter for supervision of other normal pregnancy, unspecified trimester: Secondary | ICD-10-CM

## 2012-05-31 NOTE — Progress Notes (Signed)
Routine ob check, doing well.

## 2012-05-31 NOTE — Patient Instructions (Signed)
Breastfeeding BENEFITS OF BREASTFEEDING For the baby  The first milk (colostrum) helps the baby's digestive system function better.   There are antibodies from the mother in the milk that help the baby fight off infections.   The baby has a lower incidence of asthma, allergies, and SIDS (sudden infant death syndrome).   The nutrients in breast milk are better than formulas for the baby and helps the baby's brain grow better.   Babies who breastfeed have less gas, colic, and constipation.  For the mother  Breastfeeding helps develop a very special bond between mother and baby.   It is more convenient, always available at the correct temperature and cheaper than formula feeding.   It burns calories in the mother and helps with losing weight that was gained during pregnancy.   It makes the uterus contract back down to normal size faster and slows bleeding following delivery.   Breastfeeding mothers have a lower risk of developing breast cancer.  NURSE FREQUENTLY  A healthy, full-term baby may breastfeed as often as every hour or space his or her feedings to every 3 hours.   How often to nurse will vary from baby to baby. Watch your baby for signs of hunger, not the clock.   Nurse as often as the baby requests, or when you feel the need to reduce the fullness of your breasts.   Awaken the baby if it has been 3 to 4 hours since the last feeding.   Frequent feeding will help the mother make more milk and will prevent problems like sore nipples and engorgement of the breasts.  BABY'S POSITION AT THE BREAST  Whether lying down or sitting, be sure that the baby's tummy is facing your tummy.   Support the breast with 4 fingers underneath the breast and the thumb above. Make sure your fingers are well away from the nipple and baby's mouth.   Stroke the baby's lips and cheek closest to the breast gently with your finger or nipple.   When the baby's mouth is open wide enough, place all  of your nipple and as much of the dark area around the nipple as possible into your baby's mouth.   Pull the baby in close so the tip of the nose and the baby's cheeks touch the breast during the feeding.  FEEDINGS  The length of each feeding varies from baby to baby and from feeding to feeding.   The baby must suck about 2 to 3 minutes for your milk to get to him or her. This is called a "let down." For this reason, allow the baby to feed on each breast as long as he or she wants. Your baby will end the feeding when he or she has received the right balance of nutrients.   To break the suction, put your finger into the corner of the baby's mouth and slide it between his or her gums before removing your breast from his or her mouth. This will help prevent sore nipples.  REDUCING BREAST ENGORGEMENT  In the first week after your baby is born, you may experience signs of breast engorgement. When breasts are engorged, they feel heavy, warm, full, and may be tender to the touch. You can reduce engorgement if you:   Nurse frequently, every 2 to 3 hours. Mothers who breastfeed early and often have fewer problems with engorgement.   Place light ice packs on your breasts between feedings. This reduces swelling. Wrap the ice packs in a   lightweight towel to protect your skin.   Apply moist hot packs to your breast for 5 to 10 minutes before each feeding. This increases circulation and helps the milk flow.   Gently massage your breast before and during the feeding.   Make sure that the baby empties at least one breast at every feeding before switching sides.   Use a breast pump to empty the breasts if your baby is sleepy or not nursing well. You may also want to pump if you are returning to work or or you feel you are getting engorged.   Avoid bottle feeds, pacifiers or supplemental feedings of water or juice in place of breastfeeding.   Be sure the baby is latched on and positioned properly while  breastfeeding.   Prevent fatigue, stress, and anemia.   Wear a supportive bra, avoiding underwire styles.   Eat a balanced diet with enough fluids.  If you follow these suggestions, your engorgement should improve in 24 to 48 hours. If you are still experiencing difficulty, call your lactation consultant or caregiver. IS MY BABY GETTING ENOUGH MILK? Sometimes, mothers worry about whether their babies are getting enough milk. You can be assured that your baby is getting enough milk if:  The baby is actively sucking and you hear swallowing.   The baby nurses at least 8 to 12 times in a 24 hour time period. Nurse your baby until he or she unlatches or falls asleep at the first breast (at least 10 to 20 minutes), then offer the second side.   The baby is wetting 5 to 6 disposable diapers (6 to 8 cloth diapers) in a 24 hour period by 5 to 6 days of age.   The baby is having at least 2 to 3 stools every 24 hours for the first few months. Breast milk is all the food your baby needs. It is not necessary for your baby to have water or formula. In fact, to help your breasts make more milk, it is best not to give your baby supplemental feedings during the early weeks.   The stool should be soft and yellow.   The baby should gain 4 to 7 ounces per week after he is 4 days old.  TAKE CARE OF YOURSELF Take care of your breasts by:  Bathing or showering daily.   Avoiding the use of soaps on your nipples.   Start feedings on your left breast at one feeding and on your right breast at the next feeding.   You will notice an increase in your milk supply 2 to 5 days after delivery. You may feel some discomfort from engorgement, which makes your breasts very firm and often tender. Engorgement "peaks" out within 24 to 48 hours. In the meantime, apply warm moist towels to your breasts for 5 to 10 minutes before feeding. Gentle massage and expression of some milk before feeding will soften your breasts, making  it easier for your baby to latch on. Wear a well fitting nursing bra and air dry your nipples for 10 to 15 minutes after each feeding.   Only use cotton bra pads.   Only use pure lanolin on your nipples after nursing. You do not need to wash it off before nursing.  Take care of yourself by:   Eating well-balanced meals and nutritious snacks.   Drinking milk, fruit juice, and water to satisfy your thirst (about 8 glasses a day).   Getting plenty of rest.   Increasing calcium in   your diet (1200 mg a day).   Avoiding foods that you notice affect the baby in a bad way.  SEEK MEDICAL CARE IF:   You have any questions or difficulty with breastfeeding.   You need help.   You have a hard, red, sore area on your breast, accompanied by a fever of 100.5 F (38.1 C) or more.   Your baby is too sleepy to eat well or is having trouble sleeping.   Your baby is wetting less than 6 diapers per day, by 5 days of age.   Your baby's skin or white part of his or her eyes is more yellow than it was in the hospital.   You feel depressed.  Document Released: 09/04/2005 Document Revised: 08/24/2011 Document Reviewed: 04/19/2009 ExitCare Patient Information 2012 [employer]. 

## 2012-05-31 NOTE — Progress Notes (Signed)
Doing well Membranes stripped 

## 2012-06-04 ENCOUNTER — Ambulatory Visit (INDEPENDENT_AMBULATORY_CARE_PROVIDER_SITE_OTHER): Payer: Managed Care, Other (non HMO) | Admitting: Obstetrics & Gynecology

## 2012-06-04 VITALS — BP 124/76 | Wt 274.0 lb

## 2012-06-04 DIAGNOSIS — N390 Urinary tract infection, site not specified: Secondary | ICD-10-CM

## 2012-06-04 DIAGNOSIS — O2390 Unspecified genitourinary tract infection in pregnancy, unspecified trimester: Secondary | ICD-10-CM

## 2012-06-04 DIAGNOSIS — B951 Streptococcus, group B, as the cause of diseases classified elsewhere: Secondary | ICD-10-CM

## 2012-06-04 DIAGNOSIS — Z3490 Encounter for supervision of normal pregnancy, unspecified, unspecified trimester: Secondary | ICD-10-CM

## 2012-06-04 DIAGNOSIS — Z3480 Encounter for supervision of other normal pregnancy, unspecified trimester: Secondary | ICD-10-CM

## 2012-06-04 NOTE — Patient Instructions (Signed)
Return to clinic for any obstetric concerns or go to MAU for evaluation  

## 2012-06-04 NOTE — Progress Notes (Signed)
No complaints or concerns.  Fetal movement and labor precautions reviewed. 

## 2012-06-06 ENCOUNTER — Inpatient Hospital Stay (HOSPITAL_COMMUNITY)
Admission: AD | Admit: 2012-06-06 | Discharge: 2012-06-06 | Disposition: A | Discharge status: Home / Self Care | Payer: Managed Care, Other (non HMO) | Source: Ambulatory Visit | Attending: Obstetrics and Gynecology | Admitting: Obstetrics and Gynecology

## 2012-06-06 ENCOUNTER — Encounter (HOSPITAL_COMMUNITY): Payer: Self-pay | Admitting: *Deleted

## 2012-06-06 DIAGNOSIS — O479 False labor, unspecified: Secondary | ICD-10-CM | POA: Insufficient documentation

## 2012-06-06 DIAGNOSIS — Z3490 Encounter for supervision of normal pregnancy, unspecified, unspecified trimester: Secondary | ICD-10-CM

## 2012-06-06 LAB — WET PREP, GENITAL: Trich, Wet Prep: NONE SEEN

## 2012-06-06 NOTE — MAU Provider Note (Signed)
  History     CSN: [id number]  Arrival date and time: 06/06/12 2115   First Provider Initiated Contact with Patient 06/06/12 2147      Chief Complaint  Patient presents with  . Labor Eval   HPI Started having contractions around 4 PM today, about every 3-4 minutes. Had several BM, diarrhea. Had gush of fluid getting up from toilet but not sure if urine or not. No further fluid leak. No bleeding. Baby moving well (girl). [facility], last visit 2 days ago, dilated to 1.5-2/50%. Contractions are less frequent now.   OB History    Grav Para Term Preterm Abortions TAB SAB Ect Mult Living   5 4 4  0 0 0 0 0 0 4      No past medical history on file.  No past surgical history on file.  Family History  Problem Relation Age of Onset  . Diabetes type I Father 17  . Hypertension Father     History  Substance Use Topics  . Smoking status: Never Smoker   . Smokeless tobacco: Never Used  . Alcohol Use: No    Allergies: No Known Allergies  Prescriptions prior to admission  Medication Sig Dispense Refill  . clotrimazole-betamethasone (LOTRISONE) cream Apply topically 2 (two) times daily.  30 g  0  . ondansetron (ZOFRAN) 4 MG tablet Take 4 mg by mouth every 8 (eight) hours as needed.      . Prenat-FeFum-FePo-FA-Omega 3 (CONCEPT DHA) 53.5-38-1 MG CAPS Take 1 capsule by mouth daily.  30 capsule  11    Review of Systems  Constitutional: Negative for fever and chills.  Eyes: Negative for blurred vision and double vision.  Respiratory: Negative for shortness of breath.   Cardiovascular: Negative for chest pain.  Gastrointestinal: Negative for nausea, vomiting, diarrhea and constipation.  Genitourinary: Negative for dysuria, urgency and frequency.  Neurological: Negative for dizziness and headaches.   Physical Exam   Blood pressure 146/76, pulse 80, temperature 97.5 F (36.4 C), temperature source Oral, resp. rate 18, height 5\' 6"  (1.676 m), weight 124.286 kg (274 lb),  last menstrual period 09/07/2011.  Physical Exam  Constitutional: She is oriented to person, place, and time. She appears well-developed and well-nourished. No distress.  HENT:  Head: Normocephalic and atraumatic.  Eyes: Conjunctivae normal and EOM are normal.  Neck: Normal range of motion. Neck supple.  Cardiovascular: Normal rate, regular rhythm and normal heart sounds.   Respiratory: Effort normal and breath sounds normal. No respiratory distress.  GI: Soft. There is no tenderness. There is no rebound and no guarding.       Gravid.  Genitourinary:       Yellow/creamy and watery discharge.  Musculoskeletal: Normal range of motion. She exhibits no edema and no tenderness.  Neurological: She is alert and oriented to person, place, and time.  Skin: Skin is warm and dry.  Dilation: 2 Effacement (%): 50 Cervical Position: Posterior Station: -3 Presentation: Vertex Exam by:: a. harper, rnc-ob   MAU Course  Procedures  NST:  Baseline 145, moderate variability, accels present, no decels. Cat I Toco:  Irregular/irritability  Assessment and Plan  26 y.o. [redacted]w[redacted]d at [redacted]w[redacted]d with possible ROM/contractions - Ferning negative. Does have clue cells and yeast. Likely urine vs vaginal discharge - Contractions have slowed down. No cervical change. - Few yeast and clue cells. Pt declines treatment at this time. - Labor precautions given.   Napoleon Form 06/06/2012, 9:55 PM

## 2012-06-06 NOTE — MAU Note (Signed)
Pt presents with contractions since 1545 not q 3 minutes.  Pt with possible srom at 1930 clear fluid. + FM. Denies PIh symptoms or bleeding.

## 2012-06-07 ENCOUNTER — Inpatient Hospital Stay (HOSPITAL_COMMUNITY)
Admission: AD | Admit: 2012-06-07 | Discharge: 2012-06-09 | DRG: 775 | Disposition: A | Discharge status: Home / Self Care | Payer: Managed Care, Other (non HMO) | Source: Ambulatory Visit | Attending: Obstetrics and Gynecology | Admitting: Obstetrics and Gynecology

## 2012-06-07 ENCOUNTER — Encounter (HOSPITAL_COMMUNITY): Payer: Self-pay | Admitting: *Deleted

## 2012-06-07 DIAGNOSIS — Z22330 Carrier of Group B streptococcus: Secondary | ICD-10-CM

## 2012-06-07 DIAGNOSIS — O99892 Other specified diseases and conditions complicating childbirth: Principal | ICD-10-CM | POA: Diagnosis present

## 2012-06-07 DIAGNOSIS — Z3490 Encounter for supervision of normal pregnancy, unspecified, unspecified trimester: Secondary | ICD-10-CM

## 2012-06-07 DIAGNOSIS — O9989 Other specified diseases and conditions complicating pregnancy, childbirth and the puerperium: Secondary | ICD-10-CM

## 2012-06-07 DIAGNOSIS — O2340 Unspecified infection of urinary tract in pregnancy, unspecified trimester: Secondary | ICD-10-CM

## 2012-06-07 LAB — RPR: RPR Ser Ql: NONREACTIVE

## 2012-06-07 LAB — CBC
Platelets: 186 10*3/uL (ref 150–400)
RBC: 3.75 MIL/uL — ABNORMAL LOW (ref 3.87–5.11)
RDW: 13.4 % (ref 11.5–15.5)
WBC: 11.1 10*3/uL — ABNORMAL HIGH (ref 4.0–10.5)

## 2012-06-07 LAB — TYPE AND SCREEN
ABO/RH(D): B POS
Antibody Screen: NEGATIVE

## 2012-06-07 LAB — ABO/RH: ABO/RH(D): B POS

## 2012-06-07 MED ORDER — LACTATED RINGERS IV SOLN: 500.0000 mL | INTRAVENOUS | Status: DC | PRN

## 2012-06-07 MED ORDER — DIPHENHYDRAMINE HCL 25 MG PO CAPS: 25.0000 mg | ORAL_CAPSULE | Freq: Four times a day (QID) | ORAL | Status: DC | PRN

## 2012-06-07 MED ORDER — TETANUS-DIPHTH-ACELL PERTUSSIS 5-2.5-18.5 LF-MCG/0.5 IM SUSP: 0.5000 mL | Freq: Once | INTRAMUSCULAR | Status: DC

## 2012-06-07 MED ORDER — OXYCODONE-ACETAMINOPHEN 5-325 MG PO TABS: 1.0000 | ORAL_TABLET | ORAL | Status: DC | PRN

## 2012-06-07 MED ORDER — ONDANSETRON HCL 4 MG/2ML IJ SOLN: 4.0000 mg | INTRAMUSCULAR | Status: DC | PRN

## 2012-06-07 MED ORDER — BENZOCAINE-MENTHOL 20-0.5 % EX AERO: 1.0000 "application " | INHALATION_SPRAY | CUTANEOUS | Status: DC | PRN

## 2012-06-07 MED ORDER — IBUPROFEN 600 MG PO TABS
600.0000 mg | ORAL_TABLET | Freq: Four times a day (QID) | ORAL | Status: DC
  Administered 2012-06-07 – 2012-06-09 (×8): 600 mg via ORAL
  Filled 2012-06-07 (×8): qty 1

## 2012-06-07 MED ORDER — LANOLIN HYDROUS EX OINT: TOPICAL_OINTMENT | CUTANEOUS | Status: DC | PRN

## 2012-06-07 MED ORDER — PRENATAL MULTIVITAMIN CH
1.0000 | ORAL_TABLET | Freq: Every day | ORAL | Status: DC
  Administered 2012-06-08 – 2012-06-09 (×2): 1 via ORAL
  Filled 2012-06-07 (×2): qty 1

## 2012-06-07 MED ORDER — SIMETHICONE 80 MG PO CHEW: 80.0000 mg | CHEWABLE_TABLET | ORAL | Status: DC | PRN

## 2012-06-07 MED ORDER — ZOLPIDEM TARTRATE 5 MG PO TABS: 5.0000 mg | ORAL_TABLET | Freq: Every evening | ORAL | Status: DC | PRN

## 2012-06-07 MED ORDER — ONDANSETRON HCL 4 MG/2ML IJ SOLN: 4.0000 mg | Freq: Four times a day (QID) | INTRAMUSCULAR | Status: DC | PRN

## 2012-06-07 MED ORDER — IBUPROFEN 600 MG PO TABS
600.0000 mg | ORAL_TABLET | Freq: Four times a day (QID) | ORAL | Status: DC | PRN
  Administered 2012-06-07: 600 mg via ORAL
  Filled 2012-06-07: qty 1

## 2012-06-07 MED ORDER — BUTORPHANOL TARTRATE 1 MG/ML IJ SOLN
1.0000 mg | INTRAMUSCULAR | Status: DC | PRN
  Administered 2012-06-07 (×2): 1 mg via INTRAVENOUS
  Filled 2012-06-07 (×2): qty 1

## 2012-06-07 MED ORDER — BENZOCAINE-MENTHOL 20-0.5 % EX AERO
1.0000 "application " | INHALATION_SPRAY | Freq: Four times a day (QID) | CUTANEOUS | Status: DC | PRN
  Administered 2012-06-07: 1 via TOPICAL
  Filled 2012-06-07: qty 56

## 2012-06-07 MED ORDER — OXYTOCIN 40 UNITS IN LACTATED RINGERS INFUSION - SIMPLE MED
62.5000 mL/h | Freq: Once | INTRAVENOUS | Status: AC
  Administered 2012-06-07: 999 mL/h via INTRAVENOUS
  Filled 2012-06-07: qty 1000

## 2012-06-07 MED ORDER — FLEET ENEMA 7-19 GM/118ML RE ENEM: 1.0000 | ENEMA | Freq: Every day | RECTAL | Status: DC | PRN

## 2012-06-07 MED ORDER — LACTATED RINGERS IV SOLN
INTRAVENOUS | Status: DC
  Administered 2012-06-07 (×2): via INTRAVENOUS

## 2012-06-07 MED ORDER — SENNOSIDES-DOCUSATE SODIUM 8.6-50 MG PO TABS
2.0000 | ORAL_TABLET | Freq: Every day | ORAL | Status: DC
  Administered 2012-06-07 – 2012-06-08 (×2): 2 via ORAL

## 2012-06-07 MED ORDER — BUTORPHANOL TARTRATE 1 MG/ML IJ SOLN: 1.0000 mg | INTRAMUSCULAR | Status: DC | PRN

## 2012-06-07 MED ORDER — ONDANSETRON HCL 4 MG PO TABS: 4.0000 mg | ORAL_TABLET | ORAL | Status: DC | PRN

## 2012-06-07 MED ORDER — DIBUCAINE 1 % RE OINT: 1.0000 "application " | TOPICAL_OINTMENT | RECTAL | Status: DC | PRN

## 2012-06-07 MED ORDER — OXYTOCIN BOLUS FROM INFUSION
500.0000 mL | Freq: Once | INTRAVENOUS | Status: DC
  Filled 2012-06-07: qty 500

## 2012-06-07 MED ORDER — ACETAMINOPHEN 325 MG PO TABS: 650.0000 mg | ORAL_TABLET | ORAL | Status: DC | PRN

## 2012-06-07 MED ORDER — LIDOCAINE HCL (PF) 1 % IJ SOLN
30.0000 mL | INTRAMUSCULAR | Status: DC | PRN
  Filled 2012-06-07: qty 30

## 2012-06-07 MED ORDER — AMPICILLIN SODIUM 2 G IJ SOLR
2.0000 g | Freq: Four times a day (QID) | INTRAMUSCULAR | Status: DC
  Administered 2012-06-07: 2 g via INTRAVENOUS
  Filled 2012-06-07 (×2): qty 2000

## 2012-06-07 MED ORDER — CITRIC ACID-SODIUM CITRATE 334-500 MG/5ML PO SOLN: 30.0000 mL | ORAL | Status: DC | PRN

## 2012-06-07 MED ORDER — WITCH HAZEL-GLYCERIN EX PADS: 1.0000 "application " | MEDICATED_PAD | CUTANEOUS | Status: DC | PRN

## 2012-06-07 NOTE — MAU Note (Signed)
Pt states her contractions are getting stronger and closer about 4 minutes apart

## 2012-06-07 NOTE — H&P (Signed)
[name] is a 26 y.o. female presenting for onset of labor.  Patient has been having contractions and was evaluated yesterday and sent home for early stage of labor.  Presents with worsening contractions and now cervical change from 2 to 4 in 2 hours.  Patient reports some light spotting/bloody show at home before coming in.  +FM.  No LOF, although did report some yesterday when she presented-- negative fern both yesterday and today.  Care was at [facility]. Maternal Medical History:  Reason for admission: Reason for admission: contractions.  Contractions: Onset was 2 days ago.   Frequency: regular.   Perceived severity is strong.    Fetal activity: Perceived fetal activity is normal.    Prenatal complications: Bleeding.   No hypertension.   Prenatal Complications - Diabetes: none.    OB History    Grav Para Term Preterm Abortions TAB SAB Ect Mult Living   5 4 4  0 0 0 0 0 0 4     History reviewed. No pertinent past medical history. History reviewed. No pertinent past surgical history. Family History: family history includes Diabetes type I (age of onset:40) in her father and Hypertension in her father. Social History:  reports that she has never smoked. She has never used smokeless tobacco. She reports that she does not drink alcohol or use illicit drugs.   Prenatal Transfer Tool  Maternal Diabetes: No Genetic Screening: Normal Maternal Ultrasounds/Referrals: Normal Fetal Ultrasounds or other Referrals:  None Maternal Substance Abuse:  No Significant Maternal Medications:  None Significant Maternal Lab Results:  Lab values include: Group B Strep positive Other Comments:  None  ROS  Dilation: 4 Effacement (%): 90 Station: 0 Exam by:: Beth Topp,RN Blood pressure 128/66, pulse 71, temperature 97.8 F (36.6 C), temperature source Oral, resp. rate 18, height 5\' 6"  (1.676 m), weight 124.286 kg (274 lb), last menstrual period 09/07/2011, SpO2 100.00%. Maternal Exam:    Uterine Assessment: Contraction strength is firm.  Contraction frequency is regular.   Abdomen: Fetal presentation: vertex  Pelvis: adequate for delivery.   Cervix: Cervix evaluated by digital exam.     Fetal Exam Fetal Monitor Review: Baseline rate: 130.  Variability: moderate (6-25 bpm).   Pattern: no decelerations and accelerations present.    Fetal State Assessment: Category I - tracings are normal.     Physical Exam  Constitutional: She appears well-developed and well-nourished.  Cardiovascular: Normal rate, regular rhythm, normal heart sounds and intact distal pulses.   Respiratory: Effort normal and breath sounds normal. No respiratory distress.  GI:       gravid  Musculoskeletal: She exhibits no edema.  Neurological: She is alert.  Skin: Skin is warm.    Prenatal labs: ABO, Rh: B/POS/-- (01/23 1631) Antibody: NEG (01/23 1631) Rubella: 38.6 (01/23 1631) RPR: NON REAC (07/09 1052)  HBsAg: NEGATIVE (01/23 1631)  HIV: NON REACTIVE (07/09 1052)  GBS:   POSITIVE  Assessment/Plan: 25yo [id number] @ 39.1 in active labor -Admit to L&D -Amp 2g for GBS positive -Epidural once on L&D -Category 1 fetal heart tracing  Beth [name] 06/07/2012, 7:19 AM

## 2012-06-07 NOTE — H&P (Signed)
Attestation of Attending Supervision of Advanced Practitioner (CNM/NP): Evaluation and management procedures were performed by the Advanced Practitioner under my supervision and collaboration.  I have reviewed the Advanced Practitioner's note and chart, and I agree with the management and plan.  Jerianne Anselmo 06/07/2012 7:47 AM

## 2012-06-07 NOTE — MAU Note (Signed)
PT SAYS SHE  WAS HERE  Thursday NIGHT -  2 CM.  HAS RETURNED  HURTING.  DENIES HSV AND MRSA.

## 2012-06-08 NOTE — Discharge Summary (Addendum)
I examined patient and agree with SNM's note and plan of care.  Cheral Marker, CNM, WHNP-BC

## 2012-06-08 NOTE — Discharge Summary (Signed)
Obstetric Discharge Summary Reason for Admission: onset of labor Prenatal Procedures: none Intrapartum Procedures: spontaneous vaginal delivery Postpartum Procedures: none Complications-Operative and Postpartum: none Hemoglobin  Date Value Range Status  06/07/2012 10.3* 12.0 - 15.0 g/dL Final     HCT  Date Value Range Status  06/07/2012 32.1* 36.0 - 46.0 % Final    Physical Exam:  General: alert and cooperative Lochia: appropriate Uterine Fundus: firm IDVT Evaluation: No evidence of DVT seen on physical exam.  Discharge Diagnoses: Term Pregnancy-delivered  Discharge Information: Date: 06/08/2012 Activity: pelvic rest Diet: routine Medications: PNV and Ibuprofen Condition: stable Instructions: refer to practice specific booklet Discharge to: home   Newborn Data: Live born female  Birth Weight: 7 lb 4 oz (3289 g) APGAR: 8, 9  Home with mother.  Lawernce Pitts 06/08/2012, 9:00 AM

## 2012-06-08 NOTE — Discharge Summary (Signed)
Agree with above.  Jaynie Collins, M.D. 06/08/2012 10:31 AM

## 2012-06-09 MED ORDER — INFLUENZA VIRUS VACC SPLIT PF IM SUSP
0.5000 mL | Freq: Once | INTRAMUSCULAR | Status: AC
  Administered 2012-06-09: 0.5 mL via INTRAMUSCULAR

## 2012-06-09 MED ORDER — IBUPROFEN 600 MG PO TABS: 600.0000 mg | ORAL_TABLET | Freq: Four times a day (QID) | ORAL | Status: DC

## 2012-06-09 NOTE — Discharge Summary (Signed)
Obstetric Discharge Summary Reason for Admission: onset of labor Prenatal Procedures: none Intrapartum Procedures: spontaneous vaginal delivery Postpartum Procedures: none Complications-Operative and Postpartum: none Hemoglobin  Date Value Range Status  06/07/2012 10.3* 12.0 - 15.0 g/dL Final     HCT  Date Value Range Status  06/07/2012 32.1* 36.0 - 46.0 % Final    Physical Exam:  General: alert and cooperative Lochia: appropriate Uterine Fundus: firm DVT Evaluation: No evidence of DVT seen on physical exam.  Discharge Diagnoses: Term Pregnancy-delivered  Discharge Information: Date: 06/09/2012 Activity: pelvic rest Diet: routine Medications: PNV and Ibuprofen Condition: stable Instructions: refer to practice specific booklet Discharge to: home Follow-up Information    Follow up with [facility] @. Call in 6 weeks.       [facility]  Newborn Data: Live born female  Birth Weight: 7 lb 4 oz (3289 g) APGAR: 8, 9  Home with mother.  Beth [name] 06/09/2012, 9:07 AM

## 2012-06-09 NOTE — Discharge Summary (Signed)
I was present for the exam and agree with above.  Walker, [place] 06/09/2012 10:53 AM

## 2012-06-11 ENCOUNTER — Encounter: Payer: Managed Care, Other (non HMO) | Admitting: Obstetrics & Gynecology

## 2012-07-16 ENCOUNTER — Encounter: Payer: Self-pay | Admitting: Family Medicine

## 2012-07-16 ENCOUNTER — Ambulatory Visit (INDEPENDENT_AMBULATORY_CARE_PROVIDER_SITE_OTHER): Payer: Managed Care, Other (non HMO) | Admitting: Family Medicine

## 2012-07-16 VITALS — BP 131/86 | HR 82 | Ht 66.0 in | Wt 257.0 lb

## 2012-07-16 DIAGNOSIS — Z3042 Encounter for surveillance of injectable contraceptive: Secondary | ICD-10-CM

## 2012-07-16 DIAGNOSIS — Z3049 Encounter for surveillance of other contraceptives: Secondary | ICD-10-CM

## 2012-07-16 MED ORDER — MEDROXYPROGESTERONE ACETATE 150 MG/ML IM SUSP: 150.0000 mg | INTRAMUSCULAR | Status: DC

## 2012-07-16 MED ORDER — MEDROXYPROGESTERONE ACETATE 150 MG/ML IM SUSP
150.0000 mg | Freq: Once | INTRAMUSCULAR | Status: AC
  Administered 2012-07-16: 150 mg via INTRAMUSCULAR

## 2012-07-16 NOTE — Progress Notes (Signed)
  Subjective:     Beth [name] is a 26 y.o. female who presents for a postpartum visit. She is 6 weeks postpartum following a spontaneous vaginal delivery. I have fully reviewed the prenatal and intrapartum course. The delivery was at 39 gestational weeks. Outcome: spontaneous vaginal delivery. Anesthesia: none. Postpartum course has been uneventful. Baby's course has been normal. Baby is feeding by breast. Bleeding no bleeding. Bowel function is normal. Bladder function is normal. Patient is not sexually active. Contraception method is Depo-Provera injections. Postpartum depression screening: negative.  The following portions of the patient's history were reviewed and updated as appropriate: allergies, current medications, past family history, past medical history, past social history, past surgical history and problem list.  Review of Systems A comprehensive review of systems was negative.   Objective:    BP 131/86  Pulse 82  Ht 5\' 6"  (1.676 m)  Wt 257 lb (116.574 kg)  BMI 41.48 kg/m2  General:  alert, cooperative and appears stated age           Abdomen: soft, non-tender; bowel sounds normal; no masses,  no organomegaly   Vulva:  normal  Vagina: normal vagina  Cervix:  multiparous appearance, no cervical motion tenderness and no lesions  Corpus: normal size, contour, position, consistency, mobility, non-tender  Adnexa:  normal adnexa           Assessment:    Normal postpartum exam. Pap smear not done at today's visit.   Plan:    1. Contraception: Depo-Provera injections 2. Return if changes mind about IUD 3. Follow up in: 4 months for pap or as needed.

## 2012-07-16 NOTE — Progress Notes (Signed)
Patient is here for Depo Provera injection.  She is doing well since delivery November 20.  She has not resumed intercourse yet.

## 2012-07-16 NOTE — Patient Instructions (Signed)
Contraception Choices  Contraception (birth control) is the use of any methods or devices to prevent pregnancy. Below are some methods to help avoid pregnancy.  HORMONAL METHODS   · Contraceptive implant. This is a thin, plastic tube containing progesterone hormone. It does not contain estrogen hormone. Your caregiver inserts the tube in the inner part of the upper arm. The tube can remain in place for up to 3 years. After 3 years, the implant must be removed. The implant prevents the ovaries from releasing an egg (ovulation), thickens the cervical mucus which prevents sperm from entering the uterus, and thins the lining of the inside of the uterus.  · Progesterone-only injections. These injections are given every 3 months by your caregiver to prevent pregnancy. This synthetic progesterone hormone stops the ovaries from releasing eggs. It also thickens cervical mucus and changes the uterine lining. This makes it harder for sperm to survive in the uterus.  · Birth control pills. These pills contain estrogen and progesterone hormone. They work by stopping the egg from forming in the ovary (ovulation). Birth control pills are prescribed by a caregiver. Birth control pills can also be used to treat heavy periods.  · Minipill. This type of birth control pill contains only the progesterone hormone. They are taken every day of each month and must be prescribed by your caregiver.  · Birth control patch. The patch contains hormones similar to those in birth control pills. It must be changed once a week and is prescribed by a caregiver.  · Vaginal ring. The ring contains hormones similar to those in birth control pills. It is left in the vagina for 3 weeks, removed for 1 week, and then a new one is put back in place. The patient must be comfortable inserting and removing the ring from the vagina. A caregiver's prescription is necessary.  · Emergency contraception. Emergency contraceptives prevent pregnancy after unprotected  sexual intercourse. This pill can be taken right after sex or up to 5 days after unprotected sex. It is most effective the sooner you take the pills after having sexual intercourse. Emergency contraceptive pills are available without a prescription. Check with your pharmacist. Do not use emergency contraception as your only form of birth control.  BARRIER METHODS   · Female condom. This is a thin sheath (latex or rubber) that is worn over the penis during sexual intercourse. It can be used with spermicide to increase effectiveness.  · Female condom. This is a soft, loose-fitting sheath that is put into the vagina before sexual intercourse.  · Diaphragm. This is a soft, latex, dome-shaped barrier that must be fitted by a caregiver. It is inserted into the vagina, along with a spermicidal jelly. It is inserted before intercourse. The diaphragm should be left in the vagina for 6 to 8 hours after intercourse.  · Cervical cap. This is a round, soft, latex or plastic cup that fits over the cervix and must be fitted by a caregiver. The cap can be left in place for up to 48 hours after intercourse.  · Sponge. This is a soft, circular piece of polyurethane foam. The sponge has spermicide in it. It is inserted into the vagina after wetting it and before sexual intercourse.  · Spermicides. These are chemicals that kill or block sperm from entering the cervix and uterus. They come in the form of creams, jellies, suppositories, foam, or tablets. They do not require a prescription. They are inserted into the vagina with an applicator before having sexual intercourse.   The process must be repeated every time you have sexual intercourse.  INTRAUTERINE CONTRACEPTION  · Intrauterine device (IUD). This is a T-shaped device that is put in a woman's uterus during a menstrual period to prevent pregnancy. There are 2 types:  · Copper IUD. This type of IUD is wrapped in copper wire and is placed inside the uterus. Copper makes the uterus and  fallopian tubes produce a fluid that kills sperm. It can stay in place for 10 years.  · Hormone IUD. This type of IUD contains the hormone progestin (synthetic progesterone). The hormone thickens the cervical mucus and prevents sperm from entering the uterus, and it also thins the uterine lining to prevent implantation of a fertilized egg. The hormone can weaken or kill the sperm that get into the uterus. It can stay in place for 5 years.  PERMANENT METHODS OF CONTRACEPTION  · Female tubal ligation. This is when the woman's fallopian tubes are surgically sealed, tied, or blocked to prevent the egg from traveling to the uterus.  · Female sterilization. This is when the female has the tubes that carry sperm tied off (vasectomy). This blocks sperm from entering the vagina during sexual intercourse. After the procedure, the man can still ejaculate fluid (semen).  NATURAL PLANNING METHODS  · Natural family planning. This is not having sexual intercourse or using a barrier method (condom, diaphragm, cervical cap) on days the woman could become pregnant.  · Calendar method. This is keeping track of the length of each menstrual cycle and identifying when you are fertile.  · Ovulation method. This is avoiding sexual intercourse during ovulation.  · Symptothermal method. This is avoiding sexual intercourse during ovulation, using a thermometer and ovulation symptoms.  · Post-ovulation method. This is timing sexual intercourse after you have ovulated.  Regardless of which type or method of contraception you choose, it is important that you use condoms to protect against the transmission of sexually transmitted diseases (STDs). Talk with your caregiver about which form of contraception is most appropriate for you.  Document Released: 09/04/2005 Document Revised: 11/27/2011 Document Reviewed: 01/11/2011  [employer]® Patient Information ©2013 [employer].

## 2012-10-14 ENCOUNTER — Ambulatory Visit: Payer: Managed Care, Other (non HMO)

## 2012-10-15 ENCOUNTER — Telehealth: Payer: Self-pay | Admitting: *Deleted

## 2012-10-15 NOTE — Telephone Encounter (Signed)
Patient called to reschedule her Depo injection she had to miss because her husband had to work.  She wanted to be sure she had refill at her pharmacy, this was confirmed and she will pick up her medication and bring it to the office to be administered.

## 2012-10-18 ENCOUNTER — Ambulatory Visit: Payer: Managed Care, Other (non HMO)

## 2012-10-18 ENCOUNTER — Ambulatory Visit (INDEPENDENT_AMBULATORY_CARE_PROVIDER_SITE_OTHER): Payer: Managed Care, Other (non HMO) | Admitting: *Deleted

## 2012-10-18 DIAGNOSIS — Z3042 Encounter for surveillance of injectable contraceptive: Secondary | ICD-10-CM

## 2012-10-18 DIAGNOSIS — Z3049 Encounter for surveillance of other contraceptives: Secondary | ICD-10-CM

## 2012-10-18 MED ORDER — MEDROXYPROGESTERONE ACETATE 150 MG/ML IM SUSP
150.0000 mg | INTRAMUSCULAR | Status: DC
  Administered 2012-10-18: 150 mg via INTRAMUSCULAR

## 2012-10-29 ENCOUNTER — Ambulatory Visit: Payer: Managed Care, Other (non HMO) | Admitting: Family Medicine

## 2012-10-29 DIAGNOSIS — Z3040 Encounter for surveillance of contraceptives, unspecified: Secondary | ICD-10-CM

## 2012-11-14 IMAGING — US US OB NUCHAL TRANSLUCENCY 1ST GEST
1 series · 14 of 28 positions shown · non-contrast
Comparison: none

[Series 1: us ob nuchal translucency 1st gest · 0.07mm/px · 14 of 43 slices shown]
[im 2/43]
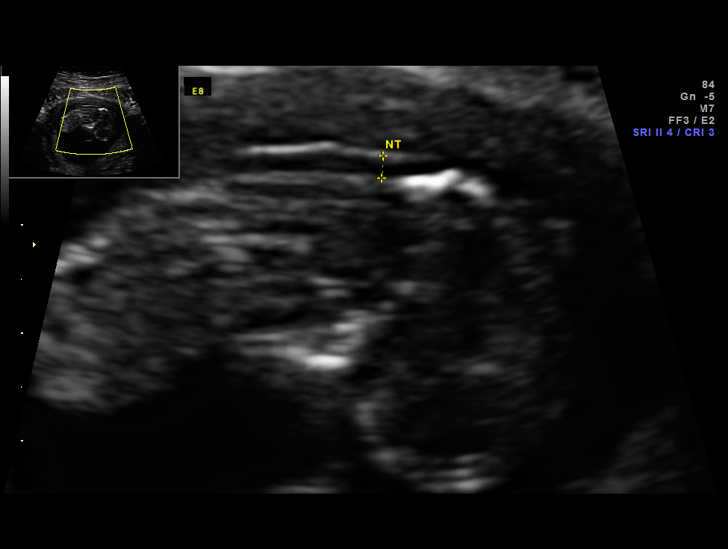
[im 5/43]
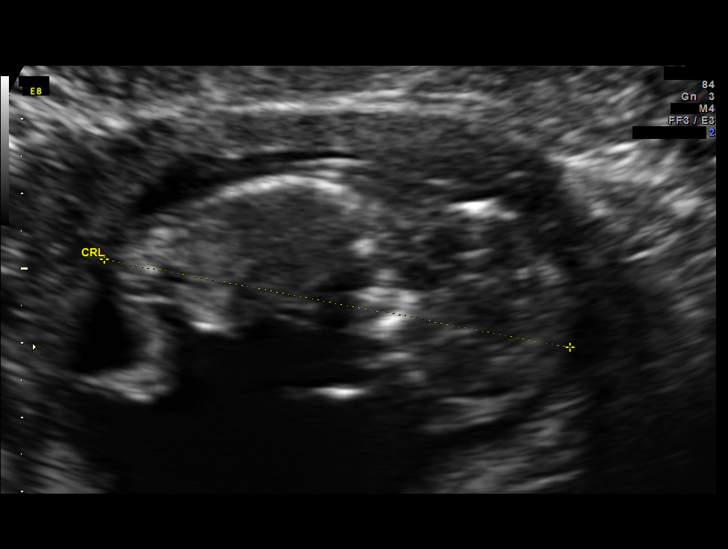
[im 8/43]
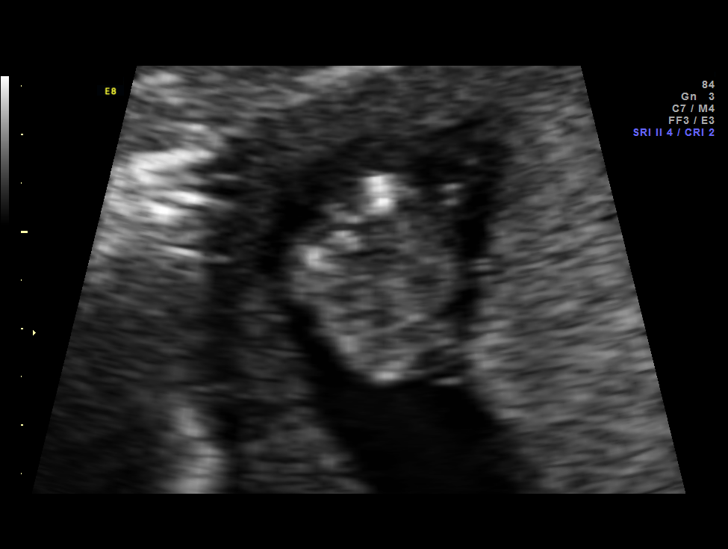
[im 11/43]
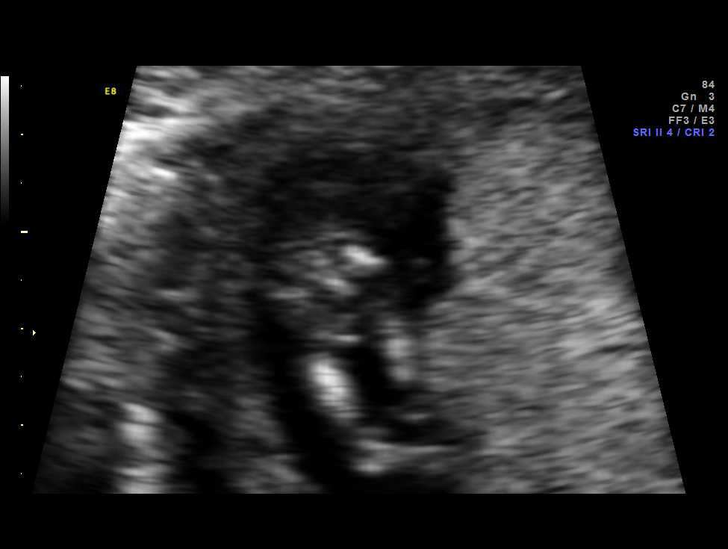
[im 15/43]
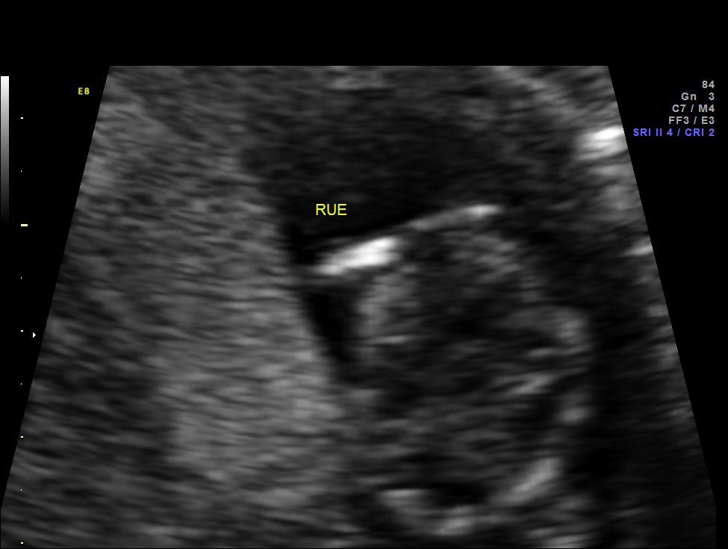
[im 18/43]
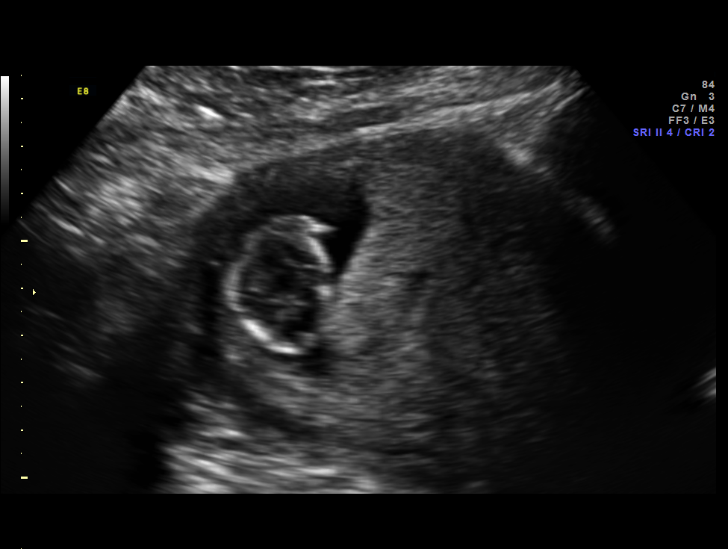
[im 21/43]
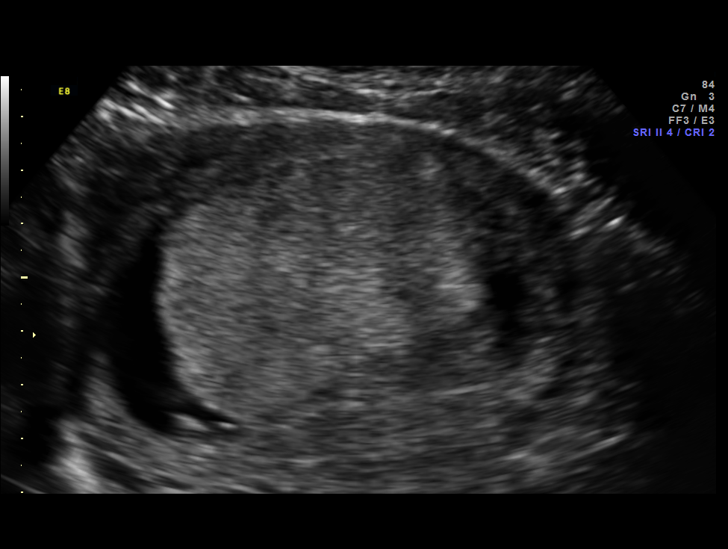
[im 24/43]
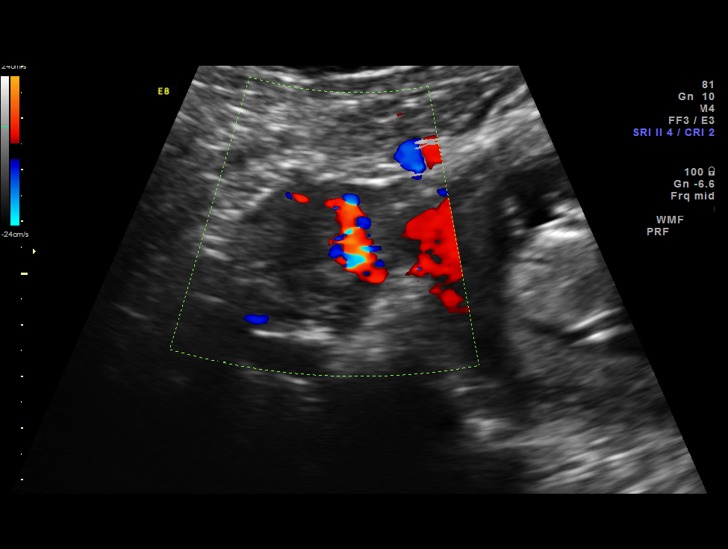
[im 27/43]
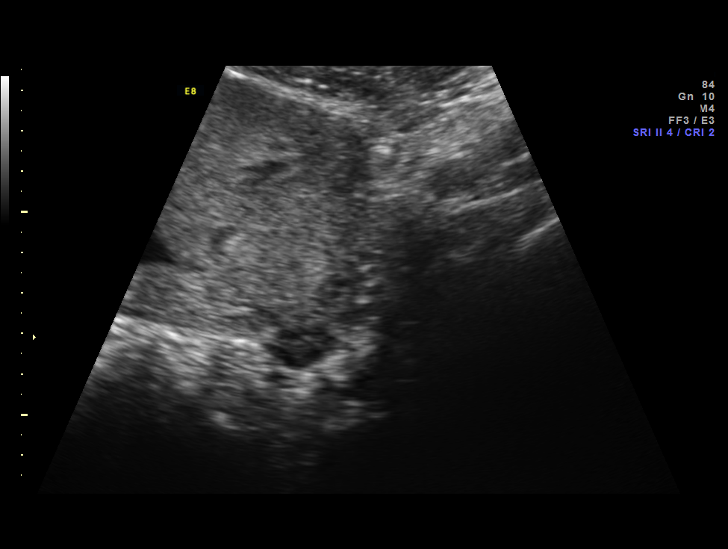
[im 30/43]
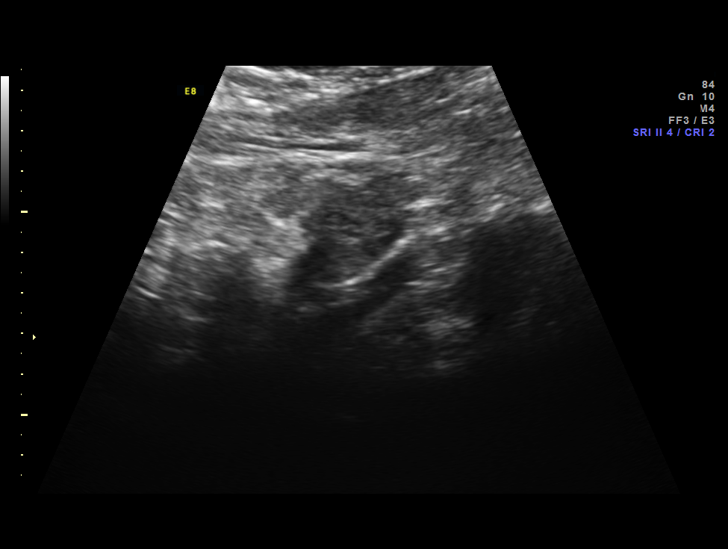
[im 33/43]
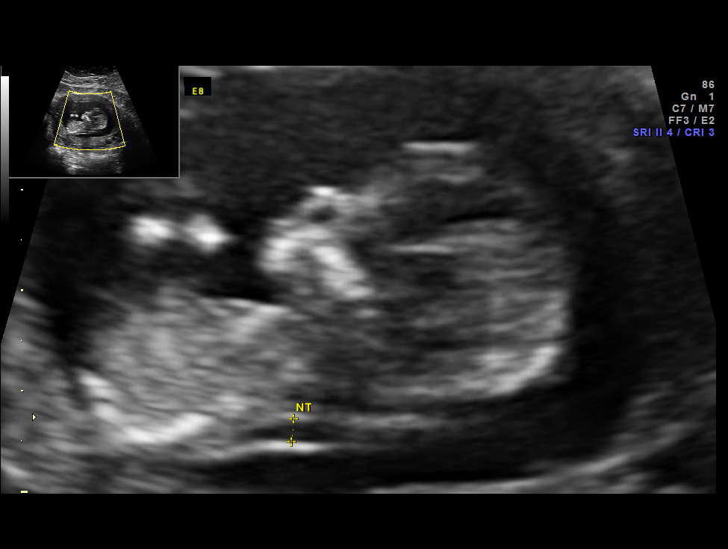
[im 36/43]
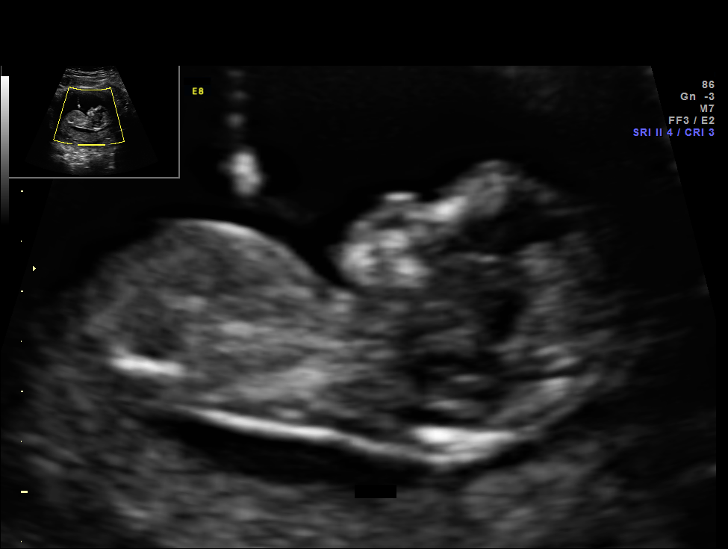
[im 39/43]
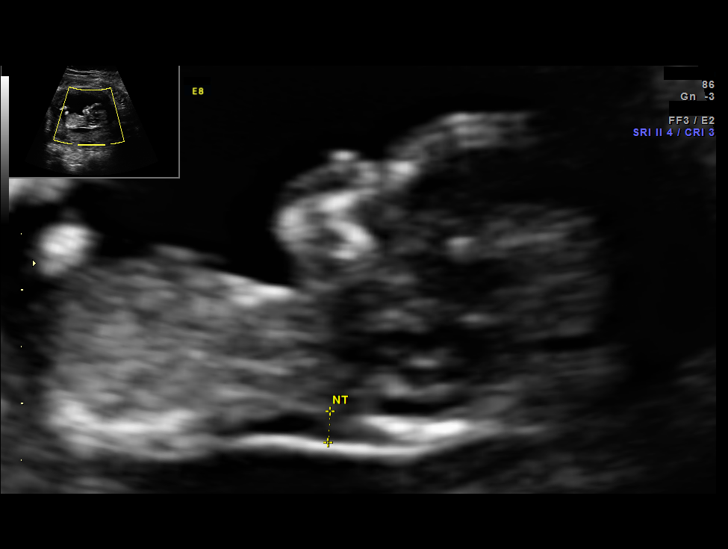
[im 43/43]
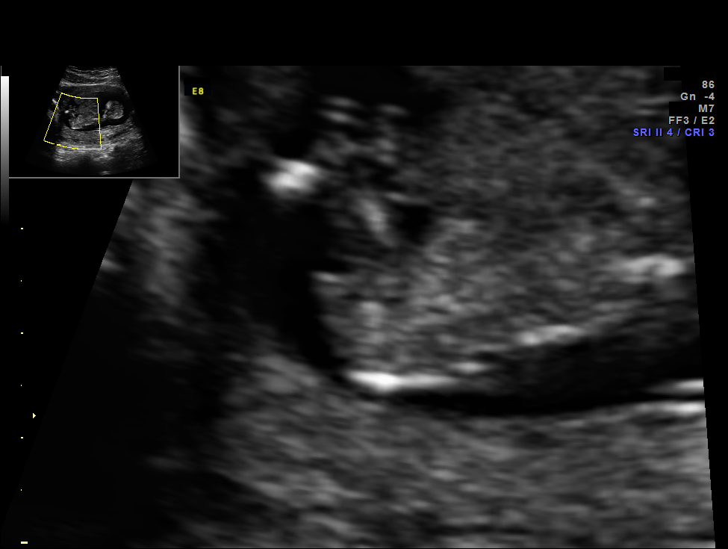

[14 of 28 positions shown; findings below may reference images not displayed]

Canned report from images found in remote index.

Refer to host system for actual result text.

## 2013-01-08 ENCOUNTER — Ambulatory Visit: Payer: Managed Care, Other (non HMO) | Admitting: *Deleted

## 2013-01-10 ENCOUNTER — Ambulatory Visit: Payer: Managed Care, Other (non HMO) | Admitting: *Deleted

## 2013-02-13 ENCOUNTER — Ambulatory Visit (INDEPENDENT_AMBULATORY_CARE_PROVIDER_SITE_OTHER): Payer: Managed Care, Other (non HMO) | Admitting: *Deleted

## 2013-02-13 DIAGNOSIS — Z3049 Encounter for surveillance of other contraceptives: Secondary | ICD-10-CM

## 2013-02-13 DIAGNOSIS — Z3042 Encounter for surveillance of injectable contraceptive: Secondary | ICD-10-CM

## 2013-02-13 MED ORDER — MEDROXYPROGESTERONE ACETATE 150 MG/ML IM SUSP
150.0000 mg | INTRAMUSCULAR | Status: DC
  Administered 2013-02-13: 150 mg via INTRAMUSCULAR

## 2013-02-13 NOTE — Progress Notes (Signed)
Patient is here today for her depo provera.  She is interested in possibly getting the Mirena IUD and will take this next three months to consider it and let us know before her next depo provera if this is something that she would like to pursue.

## 2013-05-09 ENCOUNTER — Ambulatory Visit: Payer: Managed Care, Other (non HMO)

## 2013-06-16 ENCOUNTER — Ambulatory Visit: Payer: Managed Care, Other (non HMO) | Admitting: *Deleted

## 2013-07-09 ENCOUNTER — Ambulatory Visit: Payer: Managed Care, Other (non HMO)

## 2013-07-14 ENCOUNTER — Ambulatory Visit (INDEPENDENT_AMBULATORY_CARE_PROVIDER_SITE_OTHER): Payer: Managed Care, Other (non HMO) | Admitting: *Deleted

## 2013-07-14 DIAGNOSIS — Z3049 Encounter for surveillance of other contraceptives: Secondary | ICD-10-CM

## 2013-07-14 DIAGNOSIS — Z01812 Encounter for preprocedural laboratory examination: Secondary | ICD-10-CM

## 2013-07-14 MED ORDER — MEDROXYPROGESTERONE ACETATE 150 MG/ML IM SUSP
150.0000 mg | INTRAMUSCULAR | Status: DC
  Administered 2013-07-14: 150 mg via INTRAMUSCULAR

## 2013-08-19 ENCOUNTER — Ambulatory Visit (INDEPENDENT_AMBULATORY_CARE_PROVIDER_SITE_OTHER): Payer: Managed Care, Other (non HMO) | Admitting: *Deleted

## 2013-08-19 ENCOUNTER — Other Ambulatory Visit: Payer: Managed Care, Other (non HMO)

## 2013-08-19 ENCOUNTER — Encounter: Payer: Self-pay | Admitting: *Deleted

## 2013-08-19 DIAGNOSIS — O26891 Other specified pregnancy related conditions, first trimester: Secondary | ICD-10-CM

## 2013-08-19 DIAGNOSIS — Z3480 Encounter for supervision of other normal pregnancy, unspecified trimester: Secondary | ICD-10-CM

## 2013-08-19 LAB — POCT URINE PREGNANCY: Preg Test, Ur: POSITIVE

## 2013-08-19 MED ORDER — ONDANSETRON HCL 8 MG PO TABS: 8.0000 mg | ORAL_TABLET | Freq: Three times a day (TID) | ORAL | Status: DC | PRN

## 2013-08-19 NOTE — Addendum Note (Signed)
Addended by: Barbara Cower on: 08/19/2013 02:53 PM   Modules accepted: Orders

## 2013-08-19 NOTE — Progress Notes (Signed)
Patient is here today to confirm positive home pregnancy test.  She did receive her Depo Provera after a negative pregnancy test at our office on October 27th.  She thinks she remembers having intercourse on October 24th.  These dates all match up with the bedside ultrasound within 3 days.  Positive fetal heart rate seen on ultrasound and fetal pole measures 7wks and 2 days.  Patient will follow up in our office in two weeks for New OB appointment with Dr. Shawnie Pons.  She will call us if she needs any assistance prior to this.

## 2013-08-26 ENCOUNTER — Telehealth: Payer: Self-pay | Admitting: *Deleted

## 2013-08-26 DIAGNOSIS — Z3481 Encounter for supervision of other normal pregnancy, first trimester: Secondary | ICD-10-CM

## 2013-08-26 MED ORDER — CONCEPT DHA 53.5-38-1 MG PO CAPS: 1.0000 | ORAL_CAPSULE | Freq: Every day | ORAL | Status: DC

## 2013-08-26 NOTE — Telephone Encounter (Signed)
Patient needs refill of prenatal vitamins.

## 2013-09-10 ENCOUNTER — Encounter: Payer: Self-pay | Admitting: Family Medicine

## 2013-09-10 ENCOUNTER — Ambulatory Visit (INDEPENDENT_AMBULATORY_CARE_PROVIDER_SITE_OTHER): Payer: Managed Care, Other (non HMO) | Admitting: Family Medicine

## 2013-09-10 VITALS — BP 107/74 | Wt 240.0 lb

## 2013-09-10 DIAGNOSIS — Z23 Encounter for immunization: Secondary | ICD-10-CM

## 2013-09-10 DIAGNOSIS — Z3480 Encounter for supervision of other normal pregnancy, unspecified trimester: Secondary | ICD-10-CM

## 2013-09-10 LAB — HEMOGLOBIN A1C
Hgb A1c MFr Bld: 5.8 % — ABNORMAL HIGH (ref <5.7)
Mean Plasma Glucose: 120 mg/dL — ABNORMAL HIGH (ref <117)

## 2013-09-10 NOTE — Progress Notes (Signed)
U/S shows 10w 1d CRL and + movement.  Subjective:    Beth [name] is a 27 y.o. [id number] [redacted]w[redacted]d being seen today for her initial obstetrical visit.  Patient reports no complaints. Fetal movement: not yet present.. Ob hx significant for Grandmultiparity. Got pregnant following depo with negative UPT.   OB, PMH, PSH, FH and Social histories reviewed and updated. Last pap in 2013 and WNL.  Objective:    BP 107/74  Wt 240 lb (108.863 kg)  LMP 06/26/2013  Physical Exam  Gen: Alert and oriented HEENT: [place]/AT, sclera without icterus Neck: supple, nml thyroid Breasts: no masses, everted nipples Chest: CTA B CV: RRR no r/g/m Abdomen: soft, NT Ext: no c/c/e Exam  FHT: Fetal Heart Rate (bpm): pos on u/s  Uterine Size:  10 wk  Presentation:  variable     Assessment:    Pregnancy:  [id number] @ [redacted]w[redacted]d     Plan:    Patient Active Problem List   Diagnosis Date Noted  . Grand multiparity in labor and delivery, antepartum 09/10/2013   New OB labs First trimester screen ordered Flu shot

## 2013-09-10 NOTE — Progress Notes (Signed)
[id number] 

## 2013-09-10 NOTE — Patient Instructions (Signed)
Pregnancy - First Trimester During sexual intercourse, millions of sperm go into the vagina. Only 1 sperm will penetrate and fertilize the female egg while it is in the Fallopian tube. One week later, the fertilized egg implants into the wall of the uterus. An embryo begins to develop into a baby. At 6 to 8 weeks, the eyes and face are formed and the heartbeat can be seen on ultrasound. At the end of 12 weeks (first trimester), all the baby's organs are formed. Now that you are pregnant, you will want to do everything you can to have a healthy baby. Two of the most important things are to get good prenatal care and follow your caregiver's instructions. Prenatal care is all the medical care you receive before the baby's birth. It is given to prevent, find, and treat problems during the pregnancy and childbirth. PRENATAL EXAMS  During prenatal visits, your weight, blood pressure, and urine are checked. This is done to make sure you are healthy and progressing normally during the pregnancy.  A pregnant woman should gain 25 to 35 pounds during the pregnancy. However, if you are overweight or underweight, your caregiver will advise you regarding your weight.  Your caregiver will ask and answer questions for you.  Blood work, cervical cultures, other necessary tests, and a Pap test are done during your prenatal exams. These tests are done to check on your health and the probable health of your baby. Tests are strongly recommended and done for HIV with your permission. This is the virus that causes AIDS. These tests are done because medicines can be given to help prevent your baby from being born with this infection should you have been infected without knowing it. Blood work is also used to find out your blood type, previous infections, and follow your blood levels (hemoglobin).  Low hemoglobin (anemia) is common during pregnancy. Iron and vitamins are given to help prevent this. Later in the pregnancy,  blood tests for diabetes will be done along with any other tests if any problems develop.  You may need other tests to make sure you and the baby are doing well. CHANGES DURING THE FIRST TRIMESTER  Your body goes through many changes during pregnancy. They vary from person to person. Talk to your caregiver about changes you notice and are concerned about. Changes can include:  Your menstrual period stops.  The egg and sperm carry the genes that determine what you look like. Genes from you and your partner are forming a baby. The female genes determine whether the baby is a boy or a girl.  Your body increases in girth and you may feel bloated.  Feeling sick to your stomach (nauseous) and throwing up (vomiting). If the vomiting is uncontrollable, call your caregiver.  Your breasts will begin to enlarge and become tender.  Your nipples may stick out more and become darker.  The need to urinate more. Painful urination may mean you have a bladder infection.  Tiring easily.  Loss of appetite.  Cravings for certain kinds of food.  At first, you may gain or lose a couple of pounds.  You may have changes in your emotions from day to day (excited to be pregnant or concerned something may go wrong with the pregnancy and baby).  You may have more vivid and strange dreams. HOME CARE INSTRUCTIONS   It is very important to avoid all smoking, alcohol and non-prescribed drugs during your pregnancy. These affect the formation and growth of the baby.   Avoid chemicals while pregnant to ensure the delivery of a healthy infant.  Start your prenatal visits by the 12th week of pregnancy. They are usually scheduled monthly at first, then more often in the last 2 months before delivery. Keep your caregiver's appointments. Follow your caregiver's instructions regarding medicine use, blood and lab tests, exercise, and diet.  During pregnancy, you are providing food for you and your baby. Eat regular,  well-balanced meals. Choose foods such as meat, fish, milk and other low fat dairy products, vegetables, fruits, and whole-grain breads and cereals. Your caregiver will tell you of the ideal weight gain.  You can help morning sickness by keeping soda crackers at the bedside. Eat a couple before arising in the morning. You may want to use the crackers without salt on them.  Eating 4 to 5 small meals rather than 3 large meals a day also may help the nausea and vomiting.  Drinking liquids between meals instead of during meals also seems to help nausea and vomiting.  A physical sexual relationship may be continued throughout pregnancy if there are no other problems. Problems may be early (premature) leaking of amniotic fluid from the membranes, vaginal bleeding, or belly (abdominal) pain.  Exercise regularly if there are no restrictions. Check with your caregiver or physical therapist if you are unsure of the safety of some of your exercises. Greater weight gain will occur in the last 2 trimesters of pregnancy. Exercising will help:  Control your weight.  Keep you in shape.  Prepare you for labor and delivery.  Help you lose your pregnancy weight after you deliver your baby.  Wear a good support or jogging bra for breast tenderness during pregnancy. This may help if worn during sleep too.  Ask when prenatal classes are available. Begin classes when they are offered.  Do not use hot tubs, steam rooms, or saunas.  Wear your seat belt when driving. This protects you and your baby if you are in an accident.  Avoid raw meat, uncooked cheese, cat litter boxes, and soil used by cats throughout the pregnancy. These carry germs that can cause birth defects in the baby.  The first trimester is a good time to visit your dentist for your dental health. Getting your teeth cleaned is okay. Use a softer toothbrush and brush gently during pregnancy.  Ask for help if you have financial, counseling, or  nutritional needs during pregnancy. Your caregiver will be able to offer counseling for these needs as well as refer you for other special needs.  Do not take any medicines or herbs unless told by your caregiver.  Inform your caregiver if there is any mental or physical domestic violence.  Make a list of emergency phone numbers of family, friends, hospital, and police and fire departments.  Write down your questions. Take them to your prenatal visit.  Do not douche.  Do not cross your legs.  If you have to stand for long periods of time, rotate you feet or take small steps in a circle.  You may have more vaginal secretions that may require a sanitary pad. Do not use tampons or scented sanitary pads. MEDICINES AND DRUG USE IN PREGNANCY  Take prenatal vitamins as directed. The vitamin should contain 1 milligram of folic acid. Keep all vitamins out of reach of children. Only a couple vitamins or tablets containing iron may be fatal to a baby or young child when ingested.  Avoid use of all medicines, including herbs, over-the-counter medicines, not   prescribed or suggested by your caregiver. Only take over-the-counter or prescription medicines for pain, discomfort, or fever as directed by your caregiver. Do not use aspirin, ibuprofen, or naproxen unless directed by your caregiver.  Let your caregiver also know about herbs you may be using.  Alcohol is related to a number of birth defects. This includes fetal alcohol syndrome. All alcohol, in any form, should be avoided completely. Smoking will cause low birth rate and premature babies.  Street or illegal drugs are very harmful to the baby. They are absolutely forbidden. A baby born to an addicted mother will be addicted at birth. The baby will go through the same withdrawal an adult does.  Let your caregiver know about any medicines that you have to take and for what reason you take them. SEEK MEDICAL CARE IF:  You have any concerns or  worries during your pregnancy. It is better to call with your questions if you feel they cannot wait, rather than worry about them. SEEK IMMEDIATE MEDICAL CARE IF:   An unexplained oral temperature above 102 F (38.9 C) develops, or as your caregiver suggests.  You have leaking of fluid from the vagina (birth canal). If leaking membranes are suspected, take your temperature and inform your caregiver of this when you call.  There is vaginal spotting or bleeding. Notify your caregiver of the amount and how many pads are used.  You develop a bad smelling vaginal discharge with a change in the color.  You continue to feel sick to your stomach (nauseated) and have no relief from remedies suggested. You vomit blood or coffee ground-like materials.  You lose more than 2 pounds of weight in 1 week.  You gain more than 2 pounds of weight in 1 week and you notice swelling of your face, hands, feet, or legs.  You gain 5 pounds or more in 1 week (even if you do not have swelling of your hands, face, legs, or feet).  You get exposed to German measles and have never had them.  You are exposed to fifth disease or chickenpox.  You develop belly (abdominal) pain. Round ligament discomfort is a common non-cancerous (benign) cause of abdominal pain in pregnancy. Your caregiver still must evaluate this.  You develop headache, fever, diarrhea, pain with urination, or shortness of breath.  You fall or are in a car accident or have any kind of trauma.  There is mental or physical violence in your home. Document Released: 08/29/2001 Document Revised: 05/29/2012 Document Reviewed: 03/02/2009 [employer] Patient Information 2014 [employer].  Breastfeeding Deciding to breastfeed is one of the best choices you can make for you and your baby. A change in hormones during pregnancy causes your breast tissue to grow and increases the number and size of your milk ducts. These hormones also allow proteins,  sugars, and fats from your blood supply to make breast milk in your milk-producing glands. Hormones prevent breast milk from being released before your baby is born as well as prompt milk flow after birth. Once breastfeeding has begun, thoughts of your baby, as well as his or her sucking or crying, can stimulate the release of milk from your milk-producing glands.  BENEFITS OF BREASTFEEDING For Your Baby  Your first milk (colostrum) helps your baby's digestive system function better.   There are antibodies in your milk that help your baby fight off infections.   Your baby has a lower incidence of asthma, allergies, and sudden infant death syndrome.   The nutrients   in breast milk are better for your baby than infant formulas and are designed uniquely for your baby's needs.   Breast milk improves your baby's brain development.   Your baby is less likely to develop other conditions, such as childhood obesity, asthma, or type 2 diabetes mellitus.  For You   Breastfeeding helps to create a very special bond between you and your baby.   Breastfeeding is convenient. Breast milk is always available at the correct temperature and costs nothing.   Breastfeeding helps to burn calories and helps you lose the weight gained during pregnancy.   Breastfeeding makes your uterus contract to its prepregnancy size faster and slows bleeding (lochia) after you give birth.   Breastfeeding helps to lower your risk of developing type 2 diabetes mellitus, osteoporosis, and breast or ovarian cancer later in life. SIGNS THAT YOUR BABY IS HUNGRY Early Signs of Hunger  Increased alertness or activity.  Stretching.  Movement of the head from side to side.  Movement of the head and opening of the mouth when the corner of the mouth or cheek is stroked (rooting).  Increased sucking sounds, smacking lips, cooing, sighing, or squeaking.  Hand-to-mouth movements.  Increased sucking of fingers or  hands. Late Signs of Hunger  Fussing.  Intermittent crying. Extreme Signs of Hunger Signs of extreme hunger will require calming and consoling before your baby will be able to breastfeed successfully. Do not wait for the following signs of extreme hunger to occur before you initiate breastfeeding:   Restlessness.  A loud, strong cry.   Screaming. BREASTFEEDING BASICS Breastfeeding Initiation  Find a comfortable place to sit or lie down, with your neck and back well supported.  Place a pillow or rolled up blanket under your baby to bring him or her to the level of your breast (if you are seated). Nursing pillows are specially designed to help support your arms and your baby while you breastfeed.  Make sure that your baby's abdomen is facing your abdomen.   Gently massage your breast. With your fingertips, massage from your chest wall toward your nipple in a circular motion. This encourages milk flow. You may need to continue this action during the feeding if your milk flows slowly.  Support your breast with 4 fingers underneath and your thumb above your nipple. Make sure your fingers are well away from your nipple and your baby's mouth.   Stroke your baby's lips gently with your finger or nipple.   When your baby's mouth is open wide enough, quickly bring your baby to your breast, placing your entire nipple and as much of the colored area around your nipple (areola) as possible into your baby's mouth.   More areola should be visible above your baby's upper lip than below the lower lip.   Your baby's tongue should be between his or her lower gum and your breast.   Ensure that your baby's mouth is correctly positioned around your nipple (latched). Your baby's lips should create a seal on your breast and be turned out (everted).  It is common for your baby to suck about 2 3 minutes in order to start the flow of breast milk. Latching Teaching your baby how to latch on to your  breast properly is very important. An improper latch can cause nipple pain and decreased milk supply for you and poor weight gain in your baby. Also, if your baby is not latched onto your nipple properly, he or she may swallow some air during   feeding. This can make your baby fussy. Burping your baby when you switch breasts during the feeding can help to get rid of the air. However, teaching your baby to latch on properly is still the best way to prevent fussiness from swallowing air while breastfeeding. Signs that your baby has successfully latched on to your nipple:    Silent tugging or silent sucking, without causing you pain.   Swallowing heard between every 3 4 sucks.    Muscle movement above and in front of his or her ears while sucking.  Signs that your baby has not successfully latched on to nipple:   Sucking sounds or smacking sounds from your baby while breastfeeding.  Nipple pain. If you think your baby has not latched on correctly, slip your finger into the corner of your baby's mouth to break the suction and place it between your baby's gums. Attempt breastfeeding initiation again. Signs of Successful Breastfeeding Signs from your baby:   A gradual decrease in the number of sucks or complete cessation of sucking.   Falling asleep.   Relaxation of his or her body.   Retention of a small amount of milk in his or her mouth.   Letting go of your breast by himself or herself. Signs from you:  Breasts that have increased in firmness, weight, and size 1 3 hours after feeding.   Breasts that are softer immediately after breastfeeding.  Increased milk volume, as well as a change in milk consistency and color by the 5th day of breastfeeding.   Nipples that are not sore, cracked, or bleeding. Signs That Your Baby is Getting Enough Milk  Wetting at least 3 diapers in a 24-hour period. The urine should be clear and pale yellow by age 5 days.  At least 3 stools in a  24-hour period by age 5 days. The stool should be soft and yellow.  At least 3 stools in a 24-hour period by age 7 days. The stool should be seedy and yellow.  No loss of weight greater than 10% of birth weight during the first 3 days of age.  Average weight gain of 4 7 ounces (120 210 mL) per week after age 4 days.  Consistent daily weight gain by age 5 days, without weight loss after the age of 2 weeks. After a feeding, your baby may spit up a small amount. This is common. BREASTFEEDING FREQUENCY AND DURATION Frequent feeding will help you make more milk and can prevent sore nipples and breast engorgement. Breastfeed when you feel the need to reduce the fullness of your breasts or when your baby shows signs of hunger. This is called "breastfeeding on demand." Avoid introducing a pacifier to your baby while you are working to establish breastfeeding (the first 4 6 weeks after your baby is born). After this time you may choose to use a pacifier. Research has shown that pacifier use during the first year of a baby's life decreases the risk of sudden infant death syndrome (SIDS). Allow your baby to feed on each breast as long as he or she wants. Breastfeed until your baby is finished feeding. When your baby unlatches or falls asleep while feeding from the first breast, offer the second breast. Because newborns are often sleepy in the first few weeks of life, you may need to awaken your baby to get him or her to feed. Breastfeeding times will vary from baby to baby. However, the following rules can serve as a guide to help you   ensure that your baby is properly fed:  Newborns (babies 4 weeks of age or younger) may breastfeed every 1 3 hours.  Newborns should not go longer than 3 hours during the day or 5 hours during the night without breastfeeding.  You should breastfeed your baby a minimum of 8 times in a 24-hour period until you begin to introduce solid foods to your baby at around 6 months of  age. BREAST MILK PUMPING Pumping and storing breast milk allows you to ensure that your baby is exclusively fed your breast milk, even at times when you are unable to breastfeed. This is especially important if you are going back to work while you are still breastfeeding or when you are not able to be present during feedings. Your lactation consultant can give you guidelines on how long it is safe to store breast milk.  A breast pump is a machine that allows you to pump milk from your breast into a sterile bottle. The pumped breast milk can then be stored in a refrigerator or freezer. Some breast pumps are operated by hand, while others use electricity. Ask your lactation consultant which type will work best for you. Breast pumps can be purchased, but some hospitals and breastfeeding support groups lease breast pumps on a monthly basis. A lactation consultant can teach you how to hand express breast milk, if you prefer not to use a pump.  CARING FOR YOUR BREASTS WHILE YOU BREASTFEED Nipples can become dry, cracked, and sore while breastfeeding. The following recommendations can help keep your breasts moisturized and healthy:  Avoid using soap on your nipples.   Wear a supportive bra. Although not required, special nursing bras and tank tops are designed to allow access to your breasts for breastfeeding without taking off your entire bra or top. Avoid wearing underwire style bras or extremely tight bras.  Air dry your nipples for 3 4minutes after each feeding.   Use only cotton bra pads to absorb leaked breast milk. Leaking of breast milk between feedings is normal.   Use lanolin on your nipples after breastfeeding. Lanolin helps to maintain your skin's normal moisture barrier. If you use pure lanolin you do not need to wash it off before feeding your baby again. Pure lanolin is not toxic to your baby. You may also hand express a few drops of breast milk and gently massage that milk into your  nipples and allow the milk to air dry. In the first few weeks after giving birth, some women experience extremely full breasts (engorgement). Engorgement can make your breasts feel heavy, warm, and tender to the touch. Engorgement peaks within 3 5 days after you give birth. The following recommendations can help ease engorgement:  Completely empty your breasts while breastfeeding or pumping. You may want to start by applying warm, moist heat (in the shower or with warm water-soaked hand towels) just before feeding or pumping. This increases circulation and helps the milk flow. If your baby does not completely empty your breasts while breastfeeding, pump any extra milk after he or she is finished.  Wear a snug bra (nursing or regular) or tank top for 1 2 days to signal your body to slightly decrease milk production.  Apply ice packs to your breasts, unless this is too uncomfortable for you.  Make sure that your baby is latched on and positioned properly while breastfeeding. If engorgement persists after 48 hours of following these recommendations, contact your health care provider or a lactation consultant. OVERALL   HEALTH CARE RECOMMENDATIONS WHILE BREASTFEEDING  Eat healthy foods. Alternate between meals and snacks, eating 3 of each per day. Because what you eat affects your breast milk, some of the foods may make your baby more irritable than usual. Avoid eating these foods if you are sure that they are negatively affecting your baby.  Drink milk, fruit juice, and water to satisfy your thirst (about 10 glasses a day).   Rest often, relax, and continue to take your prenatal vitamins to prevent fatigue, stress, and anemia.  Continue breast self-awareness checks.  Avoid chewing and smoking tobacco.  Avoid alcohol and drug use. Some medicines that may be harmful to your baby can pass through breast milk. It is important to ask your health care provider before taking any medicine, including all  over-the-counter and prescription medicine as well as vitamin and herbal supplements. It is possible to become pregnant while breastfeeding. If birth control is desired, ask your health care provider about options that will be safe for your baby. SEEK MEDICAL CARE IF:   You feel like you want to stop breastfeeding or have become frustrated with breastfeeding.  You have painful breasts or nipples.  Your nipples are cracked or bleeding.  Your breasts are red, tender, or warm.  You have a swollen area on either breast.  You have a fever or chills.  You have nausea or vomiting.  You have drainage other than breast milk from your nipples.  Your breasts do not become full before feedings by the 5th day after you give birth.  You feel sad and depressed.  Your baby is too sleepy to eat well.  Your baby is having trouble sleeping.   Your baby is wetting less than 3 diapers in a 24-hour period.  Your baby has less than 3 stools in a 24-hour period.  Your baby's skin or the white part of his or her eyes becomes yellow.   Your baby is not gaining weight by 5 days of age. SEEK IMMEDIATE MEDICAL CARE IF:   Your baby is overly tired (lethargic) and does not want to wake up and feed.  Your baby develops an unexplained fever. Document Released: 09/04/2005 Document Revised: 05/07/2013 Document Reviewed: 02/26/2013 [employer] Patient Information 2014 [employer].  

## 2013-09-11 LAB — OBSTETRIC PANEL
Antibody Screen: NEGATIVE
Basophils Absolute: 0 10*3/uL (ref 0.0–0.1)
Basophils Relative: 0 % (ref 0–1)
Eosinophils Relative: 2 % (ref 0–5)
HCT: 34.7 % — ABNORMAL LOW (ref 36.0–46.0)
Lymphocytes Relative: 29 % (ref 12–46)
MCHC: 34.6 g/dL (ref 30.0–36.0)
MCV: 85.7 fL (ref 78.0–100.0)
Monocytes Absolute: 0.5 10*3/uL (ref 0.1–1.0)
Neutro Abs: 2.4 10*3/uL (ref 1.7–7.7)
Platelets: 251 10*3/uL (ref 150–400)
RDW: 13.7 % (ref 11.5–15.5)
WBC: 4.1 10*3/uL (ref 4.0–10.5)

## 2013-09-11 LAB — CULTURE, OB URINE
Colony Count: NO GROWTH
Organism ID, Bacteria: NO GROWTH

## 2013-09-11 LAB — HIV ANTIBODY (ROUTINE TESTING W REFLEX): HIV: NONREACTIVE

## 2013-09-18 NOTE — L&D Delivery Note (Signed)
Delivery Note At 10:42pm a viable female was delivered via  (Presentation:Occiput Posterior with Manual Version to anterior ).  APGAR:8 ,9 ; weight pending .   Placenta status: intact ,spontaneous.  Cord: 3vessel  with the following complications: none .   Anesthesia: Epidural  Episiotomy: None Lacerations: none Suture Repair: None Est. Blood Loss (mL): 200 cc  Mom to postpartum.  Baby to Couplet care / Skin to Skin.  Called to delivery. Mother pushed over 15 min. Infant delivered to maternal abdomen. Delayed cord clamping performed. Cord clamped and cut. Active management of 3rd stage with traction and Pitocin. Placenta delivered intact with 3v cord. No tears. EBL 200cc. Counts correct. Hemostatic.   [name], Beth HunterCaleb G 04/04/2014, 10:54 PM   I was present for and supervised the delivery of this newborn. I agree with above documentation.   Beth [name], Beth BasemanKELI L, MD

## 2013-09-24 ENCOUNTER — Ambulatory Visit (HOSPITAL_COMMUNITY): Payer: Managed Care, Other (non HMO)

## 2013-10-03 ENCOUNTER — Ambulatory Visit (HOSPITAL_COMMUNITY): Payer: Managed Care, Other (non HMO) | Attending: Family Medicine

## 2013-10-03 ENCOUNTER — Other Ambulatory Visit (HOSPITAL_COMMUNITY): Payer: Managed Care, Other (non HMO)

## 2013-10-08 ENCOUNTER — Encounter: Payer: Managed Care, Other (non HMO) | Admitting: Family Medicine

## 2013-10-14 ENCOUNTER — Ambulatory Visit (INDEPENDENT_AMBULATORY_CARE_PROVIDER_SITE_OTHER): Payer: Managed Care, Other (non HMO) | Admitting: Obstetrics and Gynecology

## 2013-10-14 ENCOUNTER — Encounter: Payer: Self-pay | Admitting: Obstetrics and Gynecology

## 2013-10-14 VITALS — BP 117/76 | Wt 249.0 lb

## 2013-10-14 DIAGNOSIS — Z3480 Encounter for supervision of other normal pregnancy, unspecified trimester: Secondary | ICD-10-CM

## 2013-10-14 DIAGNOSIS — O0940 Supervision of pregnancy with grand multiparity, unspecified trimester: Principal | ICD-10-CM

## 2013-10-14 NOTE — Progress Notes (Signed)
Patient is doing well without complaints. Patient did not get first trimester screen done but is interested in Quad screen which will be done at next visit. Anatomy ultrasound ordered. Patient is also contemplating permanent sterilization

## 2013-10-14 NOTE — Progress Notes (Signed)
P - [phone] 

## 2013-11-03 ENCOUNTER — Ambulatory Visit (HOSPITAL_COMMUNITY)
Admission: RE | Admit: 2013-11-03 | Discharge: 2013-11-03 | Disposition: A | Discharge status: Home / Self Care | Payer: Managed Care, Other (non HMO) | Source: Ambulatory Visit | Attending: Obstetrics and Gynecology | Admitting: Obstetrics and Gynecology

## 2013-11-03 DIAGNOSIS — Z3689 Encounter for other specified antenatal screening: Secondary | ICD-10-CM | POA: Insufficient documentation

## 2013-11-03 DIAGNOSIS — O99210 Obesity complicating pregnancy, unspecified trimester: Principal | ICD-10-CM

## 2013-11-03 DIAGNOSIS — E669 Obesity, unspecified: Secondary | ICD-10-CM | POA: Insufficient documentation

## 2013-11-03 DIAGNOSIS — O0940 Supervision of pregnancy with grand multiparity, unspecified trimester: Secondary | ICD-10-CM

## 2013-11-04 ENCOUNTER — Encounter: Payer: Self-pay | Admitting: Obstetrics and Gynecology

## 2013-11-13 ENCOUNTER — Encounter: Payer: Managed Care, Other (non HMO) | Admitting: Family Medicine

## 2013-11-17 ENCOUNTER — Encounter: Payer: Managed Care, Other (non HMO) | Admitting: Obstetrics and Gynecology

## 2013-11-18 ENCOUNTER — Encounter: Payer: Self-pay | Admitting: Obstetrics & Gynecology

## 2013-11-18 ENCOUNTER — Ambulatory Visit (INDEPENDENT_AMBULATORY_CARE_PROVIDER_SITE_OTHER): Payer: Managed Care, Other (non HMO) | Admitting: Obstetrics & Gynecology

## 2013-11-18 VITALS — BP 117/65 | Wt 250.0 lb

## 2013-11-18 DIAGNOSIS — O99210 Obesity complicating pregnancy, unspecified trimester: Secondary | ICD-10-CM | POA: Insufficient documentation

## 2013-11-18 DIAGNOSIS — O0940 Supervision of pregnancy with grand multiparity, unspecified trimester: Principal | ICD-10-CM

## 2013-11-18 DIAGNOSIS — E669 Obesity, unspecified: Secondary | ICD-10-CM

## 2013-11-18 NOTE — Progress Notes (Signed)
Routine visit. Quad screen today. Early glucola tomorrow morning. No problems.

## 2013-11-18 NOTE — Progress Notes (Signed)
P = 92 

## 2013-11-20 ENCOUNTER — Ambulatory Visit (INDEPENDENT_AMBULATORY_CARE_PROVIDER_SITE_OTHER): Payer: Managed Care, Other (non HMO) | Admitting: Obstetrics and Gynecology

## 2013-11-20 DIAGNOSIS — Z3480 Encounter for supervision of other normal pregnancy, unspecified trimester: Secondary | ICD-10-CM

## 2013-11-20 DIAGNOSIS — O99210 Obesity complicating pregnancy, unspecified trimester: Secondary | ICD-10-CM

## 2013-11-21 LAB — GLUCOSE TOLERANCE, 1 HOUR (50G) W/O FASTING: Glucose, 1 Hour GTT: 94 mg/dL (ref 70–140)

## 2013-11-25 LAB — AFP, QUAD SCREEN
AFP: 58.5 IU/mL
Age Alone: 1:847 {titer}
Curr Gest Age: 21 wks.days
Down Syndrome Scr Risk Est: 1:35800 {titer}
HCG TOTAL: 14284 m[IU]/mL
INH: 98.2 pg/mL
INTERPRETATION-AFP: NEGATIVE
MOM FOR AFP: 1.36
MOM FOR HCG: 1.48
MoM for INH: 0.67
OPEN SPINA BIFIDA: NEGATIVE
Osb Risk: 1:8350 {titer}
TRI 18 SCR RISK EST: NEGATIVE
Trisomy 18 (Edward) Syndrome Interp.: 1:114000 {titer}
UE3 VALUE: 1.2 ng/mL
uE3 Mom: 0.99

## 2013-12-02 ENCOUNTER — Encounter: Payer: Self-pay | Admitting: Obstetrics & Gynecology

## 2013-12-11 ENCOUNTER — Encounter: Payer: Managed Care, Other (non HMO) | Admitting: Family Medicine

## 2013-12-15 ENCOUNTER — Encounter: Payer: Self-pay | Admitting: Family Medicine

## 2013-12-15 ENCOUNTER — Ambulatory Visit (INDEPENDENT_AMBULATORY_CARE_PROVIDER_SITE_OTHER): Payer: Managed Care, Other (non HMO) | Admitting: Family Medicine

## 2013-12-15 VITALS — BP 127/73 | Wt 261.0 lb

## 2013-12-15 DIAGNOSIS — Z3480 Encounter for supervision of other normal pregnancy, unspecified trimester: Secondary | ICD-10-CM

## 2013-12-15 DIAGNOSIS — O0940 Supervision of pregnancy with grand multiparity, unspecified trimester: Principal | ICD-10-CM

## 2013-12-15 NOTE — Progress Notes (Signed)
Doing well--passed early 1 hour Quad screen negative. U/S scheduled to complete anatomy 28 wk labs TDaP next visit

## 2013-12-15 NOTE — Progress Notes (Signed)
P= [id number]  

## 2013-12-15 NOTE — Patient Instructions (Signed)
Second Trimester of Pregnancy The second trimester is from week 13 through week 28, months 4 through 6. The second trimester is often a time when you feel your best. Your body has also adjusted to being pregnant, and you begin to feel better physically. Usually, morning sickness has lessened or quit completely, you may have more energy, and you may have an increase in appetite. The second trimester is also a time when the fetus is growing rapidly. At the end of the sixth month, the fetus is about 9 inches long and weighs about 1 pounds. You will likely begin to feel the baby move (quickening) between 18 and 20 weeks of the pregnancy. BODY CHANGES Your body goes through many changes during pregnancy. The changes vary from woman to woman.   Your weight will continue to increase. You will notice your lower abdomen bulging out.  You may begin to get stretch marks on your hips, abdomen, and breasts.  You may develop headaches that can be relieved by medicines approved by your caregiver.  You may urinate more often because the fetus is pressing on your bladder.  You may develop or continue to have heartburn as a result of your pregnancy.  You may develop constipation because certain hormones are causing the muscles that push waste through your intestines to slow down.  You may develop hemorrhoids or swollen, bulging veins (varicose veins).  You may have back pain because of the weight gain and pregnancy hormones relaxing your joints between the bones in your pelvis and as a result of a shift in weight and the muscles that support your balance.  Your breasts will continue to grow and be tender.  Your gums may bleed and may be sensitive to brushing and flossing.  Dark spots or blotches (chloasma, mask of pregnancy) may develop on your face. This will likely fade after the baby is born.  A dark line from your belly button to the pubic area (linea nigra) may appear. This will likely fade after  the baby is born. WHAT TO EXPECT AT YOUR PRENATAL VISITS During a routine prenatal visit:  You will be weighed to make sure you and the fetus are growing normally.  Your blood pressure will be taken.  Your abdomen will be measured to track your baby's growth.  The fetal heartbeat will be listened to.  Any test results from the previous visit will be discussed. Your caregiver may ask you:  How you are feeling.  If you are feeling the baby move.  If you have had any abnormal symptoms, such as leaking fluid, bleeding, severe headaches, or abdominal cramping.  If you have any questions. Other tests that may be performed during your second trimester include:  Blood tests that check for:  Low iron levels (anemia).  Gestational diabetes (between 24 and 28 weeks).  Rh antibodies.  Urine tests to check for infections, diabetes, or protein in the urine.  An ultrasound to confirm the proper growth and development of the baby.  An amniocentesis to check for possible genetic problems.  Fetal screens for spina bifida and Down syndrome. HOME CARE INSTRUCTIONS   Avoid all smoking, herbs, alcohol, and unprescribed drugs. These chemicals affect the formation and growth of the baby.  Follow your caregiver's instructions regarding medicine use. There are medicines that are either safe or unsafe to take during pregnancy.  Exercise only as directed by your caregiver. Experiencing uterine cramps is a good sign to stop exercising.  Continue to eat regular,   healthy meals.  Wear a good support bra for breast tenderness.  Do not use hot tubs, steam rooms, or saunas.  Wear your seat belt at all times when driving.  Avoid raw meat, uncooked cheese, cat litter boxes, and soil used by cats. These carry germs that can cause birth defects in the baby.  Take your prenatal vitamins.  Try taking a stool softener (if your caregiver approves) if you develop constipation. Eat more high-fiber  foods, such as fresh vegetables or fruit and whole grains. Drink plenty of fluids to keep your urine clear or pale yellow.  Take warm sitz baths to soothe any pain or discomfort caused by hemorrhoids. Use hemorrhoid cream if your caregiver approves.  If you develop varicose veins, wear support hose. Elevate your feet for 15 minutes, 3 4 times a day. Limit salt in your diet.  Avoid heavy lifting, wear low heel shoes, and practice good posture.  Rest with your legs elevated if you have leg cramps or low back pain.  Visit your dentist if you have not gone yet during your pregnancy. Use a soft toothbrush to brush your teeth and be gentle when you floss.  A sexual relationship may be continued unless your caregiver directs you otherwise.  Continue to go to all your prenatal visits as directed by your caregiver. SEEK MEDICAL CARE IF:   You have dizziness.  You have mild pelvic cramps, pelvic pressure, or nagging pain in the abdominal area.  You have persistent nausea, vomiting, or diarrhea.  You have a bad smelling vaginal discharge.  You have pain with urination. SEEK IMMEDIATE MEDICAL CARE IF:   You have a fever.  You are leaking fluid from your vagina.  You have spotting or bleeding from your vagina.  You have severe abdominal cramping or pain.  You have rapid weight gain or loss.  You have shortness of breath with chest pain.  You notice sudden or extreme swelling of your face, hands, ankles, feet, or legs.  You have not felt your baby move in over an hour.  You have severe headaches that do not go away with medicine.  You have vision changes. Document Released: 08/29/2001 Document Revised: 05/07/2013 Document Reviewed: 11/05/2012 [employer] Patient Information 2014 [employer].  Breastfeeding Deciding to breastfeed is one of the best choices you can make for you and your baby. A change in hormones during pregnancy causes your breast tissue to grow and increases the  number and size of your milk ducts. These hormones also allow proteins, sugars, and fats from your blood supply to make breast milk in your milk-producing glands. Hormones prevent breast milk from being released before your baby is born as well as prompt milk flow after birth. Once breastfeeding has begun, thoughts of your baby, as well as his or her sucking or crying, can stimulate the release of milk from your milk-producing glands.  BENEFITS OF BREASTFEEDING For Your Baby  Your first milk (colostrum) helps your baby's digestive system function better.   There are antibodies in your milk that help your baby fight off infections.   Your baby has a lower incidence of asthma, allergies, and sudden infant death syndrome.   The nutrients in breast milk are better for your baby than infant formulas and are designed uniquely for your baby's needs.   Breast milk improves your baby's brain development.   Your baby is less likely to develop other conditions, such as childhood obesity, asthma, or type 2 diabetes mellitus.  For   You   Breastfeeding helps to create a very special bond between you and your baby.   Breastfeeding is convenient. Breast milk is always available at the correct temperature and costs nothing.   Breastfeeding helps to burn calories and helps you lose the weight gained during pregnancy.   Breastfeeding makes your uterus contract to its prepregnancy size faster and slows bleeding (lochia) after you give birth.   Breastfeeding helps to lower your risk of developing type 2 diabetes mellitus, osteoporosis, and breast or ovarian cancer later in life. SIGNS THAT YOUR BABY IS HUNGRY Early Signs of Hunger  Increased alertness or activity.  Stretching.  Movement of the head from side to side.  Movement of the head and opening of the mouth when the corner of the mouth or cheek is stroked (rooting).  Increased sucking sounds, smacking lips, cooing, sighing, or  squeaking.  Hand-to-mouth movements.  Increased sucking of fingers or hands. Late Signs of Hunger  Fussing.  Intermittent crying. Extreme Signs of Hunger Signs of extreme hunger will require calming and consoling before your baby will be able to breastfeed successfully. Do not wait for the following signs of extreme hunger to occur before you initiate breastfeeding:   Restlessness.  A loud, strong cry.   Screaming. BREASTFEEDING BASICS Breastfeeding Initiation  Find a comfortable place to sit or lie down, with your neck and back well supported.  Place a pillow or rolled up blanket under your baby to bring him or her to the level of your breast (if you are seated). Nursing pillows are specially designed to help support your arms and your baby while you breastfeed.  Make sure that your baby's abdomen is facing your abdomen.   Gently massage your breast. With your fingertips, massage from your chest wall toward your nipple in a circular motion. This encourages milk flow. You may need to continue this action during the feeding if your milk flows slowly.  Support your breast with 4 fingers underneath and your thumb above your nipple. Make sure your fingers are well away from your nipple and your baby's mouth.   Stroke your baby's lips gently with your finger or nipple.   When your baby's mouth is open wide enough, quickly bring your baby to your breast, placing your entire nipple and as much of the colored area around your nipple (areola) as possible into your baby's mouth.   More areola should be visible above your baby's upper lip than below the lower lip.   Your baby's tongue should be between his or her lower gum and your breast.   Ensure that your baby's mouth is correctly positioned around your nipple (latched). Your baby's lips should create a seal on your breast and be turned out (everted).  It is common for your baby to suck about 2 3 minutes in order to start the  flow of breast milk. Latching Teaching your baby how to latch on to your breast properly is very important. An improper latch can cause nipple pain and decreased milk supply for you and poor weight gain in your baby. Also, if your baby is not latched onto your nipple properly, he or she may swallow some air during feeding. This can make your baby fussy. Burping your baby when you switch breasts during the feeding can help to get rid of the air. However, teaching your baby to latch on properly is still the best way to prevent fussiness from swallowing air while breastfeeding. Signs that your baby has   successfully latched on to your nipple:    Silent tugging or silent sucking, without causing you pain.   Swallowing heard between every 3 4 sucks.    Muscle movement above and in front of his or her ears while sucking.  Signs that your baby has not successfully latched on to nipple:   Sucking sounds or smacking sounds from your baby while breastfeeding.  Nipple pain. If you think your baby has not latched on correctly, slip your finger into the corner of your baby's mouth to break the suction and place it between your baby's gums. Attempt breastfeeding initiation again. Signs of Successful Breastfeeding Signs from your baby:   A gradual decrease in the number of sucks or complete cessation of sucking.   Falling asleep.   Relaxation of his or her body.   Retention of a small amount of milk in his or her mouth.   Letting go of your breast by himself or herself. Signs from you:  Breasts that have increased in firmness, weight, and size 1 3 hours after feeding.   Breasts that are softer immediately after breastfeeding.  Increased milk volume, as well as a change in milk consistency and color by the 5th day of breastfeeding.   Nipples that are not sore, cracked, or bleeding. Signs That Your Baby is Getting Enough Milk  Wetting at least 3 diapers in a 24-hour period. The urine  should be clear and pale yellow by age 5 days.  At least 3 stools in a 24-hour period by age 5 days. The stool should be soft and yellow.  At least 3 stools in a 24-hour period by age 7 days. The stool should be seedy and yellow.  No loss of weight greater than 10% of birth weight during the first 3 days of age.  Average weight gain of 4 7 ounces (120 210 mL) per week after age 4 days.  Consistent daily weight gain by age 5 days, without weight loss after the age of 2 weeks. After a feeding, your baby may spit up a small amount. This is common. BREASTFEEDING FREQUENCY AND DURATION Frequent feeding will help you make more milk and can prevent sore nipples and breast engorgement. Breastfeed when you feel the need to reduce the fullness of your breasts or when your baby shows signs of hunger. This is called "breastfeeding on demand." Avoid introducing a pacifier to your baby while you are working to establish breastfeeding (the first 4 6 weeks after your baby is born). After this time you may choose to use a pacifier. Research has shown that pacifier use during the first year of a baby's life decreases the risk of sudden infant death syndrome (SIDS). Allow your baby to feed on each breast as long as he or she wants. Breastfeed until your baby is finished feeding. When your baby unlatches or falls asleep while feeding from the first breast, offer the second breast. Because newborns are often sleepy in the first few weeks of life, you may need to awaken your baby to get him or her to feed. Breastfeeding times will vary from baby to baby. However, the following rules can serve as a guide to help you ensure that your baby is properly fed:  Newborns (babies 4 weeks of age or younger) may breastfeed every 1 3 hours.  Newborns should not go longer than 3 hours during the day or 5 hours during the night without breastfeeding.  You should breastfeed your baby a minimum of   8 times in a 24-hour period until  you begin to introduce solid foods to your baby at around 6 months of age. BREAST MILK PUMPING Pumping and storing breast milk allows you to ensure that your baby is exclusively fed your breast milk, even at times when you are unable to breastfeed. This is especially important if you are going back to work while you are still breastfeeding or when you are not able to be present during feedings. Your lactation consultant can give you guidelines on how long it is safe to store breast milk.  A breast pump is a machine that allows you to pump milk from your breast into a sterile bottle. The pumped breast milk can then be stored in a refrigerator or freezer. Some breast pumps are operated by hand, while others use electricity. Ask your lactation consultant which type will work best for you. Breast pumps can be purchased, but some hospitals and breastfeeding support groups lease breast pumps on a monthly basis. A lactation consultant can teach you how to hand express breast milk, if you prefer not to use a pump.  CARING FOR YOUR BREASTS WHILE YOU BREASTFEED Nipples can become dry, cracked, and sore while breastfeeding. The following recommendations can help keep your breasts moisturized and healthy:  Avoid using soap on your nipples.   Wear a supportive bra. Although not required, special nursing bras and tank tops are designed to allow access to your breasts for breastfeeding without taking off your entire bra or top. Avoid wearing underwire style bras or extremely tight bras.  Air dry your nipples for 3 4minutes after each feeding.   Use only cotton bra pads to absorb leaked breast milk. Leaking of breast milk between feedings is normal.   Use lanolin on your nipples after breastfeeding. Lanolin helps to maintain your skin's normal moisture barrier. If you use pure lanolin you do not need to wash it off before feeding your baby again. Pure lanolin is not toxic to your baby. You may also hand express a  few drops of breast milk and gently massage that milk into your nipples and allow the milk to air dry. In the first few weeks after giving birth, some women experience extremely full breasts (engorgement). Engorgement can make your breasts feel heavy, warm, and tender to the touch. Engorgement peaks within 3 5 days after you give birth. The following recommendations can help ease engorgement:  Completely empty your breasts while breastfeeding or pumping. You may want to start by applying warm, moist heat (in the shower or with warm water-soaked hand towels) just before feeding or pumping. This increases circulation and helps the milk flow. If your baby does not completely empty your breasts while breastfeeding, pump any extra milk after he or she is finished.  Wear a snug bra (nursing or regular) or tank top for 1 2 days to signal your body to slightly decrease milk production.  Apply ice packs to your breasts, unless this is too uncomfortable for you.  Make sure that your baby is latched on and positioned properly while breastfeeding. If engorgement persists after 48 hours of following these recommendations, contact your health care provider or a lactation consultant. OVERALL HEALTH CARE RECOMMENDATIONS WHILE BREASTFEEDING  Eat healthy foods. Alternate between meals and snacks, eating 3 of each per day. Because what you eat affects your breast milk, some of the foods may make your baby more irritable than usual. Avoid eating these foods if you are sure that they are   negatively affecting your baby.  Drink milk, fruit juice, and water to satisfy your thirst (about 10 glasses a day).   Rest often, relax, and continue to take your prenatal vitamins to prevent fatigue, stress, and anemia.  Continue breast self-awareness checks.  Avoid chewing and smoking tobacco.  Avoid alcohol and drug use. Some medicines that may be harmful to your baby can pass through breast milk. It is important to ask your  health care provider before taking any medicine, including all over-the-counter and prescription medicine as well as vitamin and herbal supplements. It is possible to become pregnant while breastfeeding. If birth control is desired, ask your health care provider about options that will be safe for your baby. SEEK MEDICAL CARE IF:   You feel like you want to stop breastfeeding or have become frustrated with breastfeeding.  You have painful breasts or nipples.  Your nipples are cracked or bleeding.  Your breasts are red, tender, or warm.  You have a swollen area on either breast.  You have a fever or chills.  You have nausea or vomiting.  You have drainage other than breast milk from your nipples.  Your breasts do not become full before feedings by the 5th day after you give birth.  You feel sad and depressed.  Your baby is too sleepy to eat well.  Your baby is having trouble sleeping.   Your baby is wetting less than 3 diapers in a 24-hour period.  Your baby has less than 3 stools in a 24-hour period.  Your baby's skin or the white part of his or her eyes becomes yellow.   Your baby is not gaining weight by 5 days of age. SEEK IMMEDIATE MEDICAL CARE IF:   Your baby is overly tired (lethargic) and does not want to wake up and feed.  Your baby develops an unexplained fever. Document Released: 09/04/2005 Document Revised: 05/07/2013 Document Reviewed: 02/26/2013 [employer] Patient Information 2014 [employer].  

## 2013-12-19 ENCOUNTER — Ambulatory Visit (HOSPITAL_COMMUNITY)
Admission: RE | Admit: 2013-12-19 | Discharge: 2013-12-19 | Disposition: A | Discharge status: Home / Self Care | Payer: Managed Care, Other (non HMO) | Source: Ambulatory Visit | Attending: Family Medicine | Admitting: Family Medicine

## 2013-12-19 DIAGNOSIS — E669 Obesity, unspecified: Secondary | ICD-10-CM | POA: Insufficient documentation

## 2013-12-19 DIAGNOSIS — Z3689 Encounter for other specified antenatal screening: Secondary | ICD-10-CM | POA: Insufficient documentation

## 2013-12-19 DIAGNOSIS — O0940 Supervision of pregnancy with grand multiparity, unspecified trimester: Secondary | ICD-10-CM

## 2013-12-19 DIAGNOSIS — O99210 Obesity complicating pregnancy, unspecified trimester: Principal | ICD-10-CM

## 2013-12-20 ENCOUNTER — Encounter: Payer: Self-pay | Admitting: Family Medicine

## 2014-01-12 ENCOUNTER — Ambulatory Visit (INDEPENDENT_AMBULATORY_CARE_PROVIDER_SITE_OTHER): Payer: Managed Care, Other (non HMO) | Admitting: Obstetrics & Gynecology

## 2014-01-12 ENCOUNTER — Encounter: Payer: Self-pay | Admitting: *Deleted

## 2014-01-12 ENCOUNTER — Encounter: Payer: Self-pay | Admitting: Obstetrics & Gynecology

## 2014-01-12 VITALS — BP 119/71 | HR 96 | Wt 267.4 lb

## 2014-01-12 DIAGNOSIS — Z23 Encounter for immunization: Secondary | ICD-10-CM

## 2014-01-12 DIAGNOSIS — Z3480 Encounter for supervision of other normal pregnancy, unspecified trimester: Secondary | ICD-10-CM

## 2014-01-12 LAB — CBC
HEMATOCRIT: 31.4 % — AB (ref 36.0–46.0)
HEMOGLOBIN: 10.9 g/dL — AB (ref 12.0–15.0)
MCH: 30 pg (ref 26.0–34.0)
MCHC: 34.7 g/dL (ref 30.0–36.0)
MCV: 86.5 fL (ref 78.0–100.0)
Platelets: 230 10*3/uL (ref 150–400)
RBC: 3.63 MIL/uL — AB (ref 3.87–5.11)
RDW: 13.9 % (ref 11.5–15.5)
WBC: 9.4 10*3/uL (ref 4.0–10.5)

## 2014-01-12 NOTE — Progress Notes (Signed)
Routine visit. Good FM. Glucola, labs, tdap. No problems. B+

## 2014-01-13 ENCOUNTER — Encounter: Payer: Self-pay | Admitting: Obstetrics & Gynecology

## 2014-01-13 LAB — GLUCOSE TOLERANCE, 1 HOUR (50G) W/O FASTING: Glucose, 1 Hour GTT: 113 mg/dL (ref 70–140)

## 2014-01-13 LAB — RPR

## 2014-01-13 LAB — HIV ANTIBODY (ROUTINE TESTING W REFLEX): HIV 1&2 Ab, 4th Generation: NONREACTIVE

## 2014-01-19 ENCOUNTER — Telehealth: Payer: Self-pay | Admitting: *Deleted

## 2014-01-19 NOTE — Telephone Encounter (Signed)
Patient called to get glucose test results.  Her test is normal.  She will keep her regular scheduled appointment.

## 2014-01-28 ENCOUNTER — Ambulatory Visit (INDEPENDENT_AMBULATORY_CARE_PROVIDER_SITE_OTHER): Payer: Managed Care, Other (non HMO) | Admitting: Family Medicine

## 2014-01-28 ENCOUNTER — Encounter: Payer: Self-pay | Admitting: Family Medicine

## 2014-01-28 VITALS — BP 125/69 | HR 81 | Wt 270.8 lb

## 2014-01-28 DIAGNOSIS — O0940 Supervision of pregnancy with grand multiparity, unspecified trimester: Principal | ICD-10-CM

## 2014-01-28 DIAGNOSIS — Z3480 Encounter for supervision of other normal pregnancy, unspecified trimester: Secondary | ICD-10-CM

## 2014-01-28 NOTE — Progress Notes (Signed)
Normal glucola Discussed circumcision S/p TDaP Doing well

## 2014-01-28 NOTE — Patient Instructions (Signed)
Third Trimester of Pregnancy The third trimester is from week 29 through week 42, months 7 through 9. The third trimester is a time when the fetus is growing rapidly. At the end of the ninth month, the fetus is about 20 inches in length and weighs 6 10 pounds.  BODY CHANGES Your body goes through many changes during pregnancy. The changes vary from woman to woman.   Your weight will continue to increase. You can expect to gain 25 35 pounds (11 16 kg) by the end of the pregnancy.  You may begin to get stretch marks on your hips, abdomen, and breasts.  You may urinate more often because the fetus is moving lower into your pelvis and pressing on your bladder.  You may develop or continue to have heartburn as a result of your pregnancy.  You may develop constipation because certain hormones are causing the muscles that push waste through your intestines to slow down.  You may develop hemorrhoids or swollen, bulging veins (varicose veins).  You may have pelvic pain because of the weight gain and pregnancy hormones relaxing your joints between the bones in your pelvis. Back aches may result from over exertion of the muscles supporting your posture.  Your breasts will continue to grow and be tender. A yellow discharge may leak from your breasts called colostrum.  Your belly button may stick out.  You may feel short of breath because of your expanding uterus.  You may notice the fetus "dropping," or moving lower in your abdomen.  You may have a bloody mucus discharge. This usually occurs a few days to a week before labor begins.  Your cervix becomes thin and soft (effaced) near your due date. WHAT TO EXPECT AT YOUR PRENATAL EXAMS  You will have prenatal exams every 2 weeks until week 36. Then, you will have weekly prenatal exams. During a routine prenatal visit:  You will be weighed to make sure you and the fetus are growing normally.  Your blood pressure is taken.  Your abdomen will  be measured to track your baby's growth.  The fetal heartbeat will be listened to.  Any test results from the previous visit will be discussed.  You may have a cervical check near your due date to see if you have effaced. At around 36 weeks, your caregiver will check your cervix. At the same time, your caregiver will also perform a test on the secretions of the vaginal tissue. This test is to determine if a type of bacteria, Group B streptococcus, is present. Your caregiver will explain this further. Your caregiver may ask you:  What your birth plan is.  How you are feeling.  If you are feeling the baby move.  If you have had any abnormal symptoms, such as leaking fluid, bleeding, severe headaches, or abdominal cramping.  If you have any questions. Other tests or screenings that may be performed during your third trimester include:  Blood tests that check for low iron levels (anemia).  Fetal testing to check the health, activity level, and growth of the fetus. Testing is done if you have certain medical conditions or if there are problems during the pregnancy. FALSE LABOR You may feel small, irregular contractions that eventually go away. These are called Braxton Hicks contractions, or false labor. Contractions may last for hours, days, or even weeks before true labor sets in. If contractions come at regular intervals, intensify, or become painful, it is best to be seen by your caregiver.    SIGNS OF LABOR   Menstrual-like cramps.  Contractions that are 5 minutes apart or less.  Contractions that start on the top of the uterus and spread down to the lower abdomen and back.  A sense of increased pelvic pressure or back pain.  A watery or bloody mucus discharge that comes from the vagina. If you have any of these signs before the 37th week of pregnancy, call your caregiver right away. You need to go to the hospital to get checked immediately. HOME CARE INSTRUCTIONS   Avoid all  smoking, herbs, alcohol, and unprescribed drugs. These chemicals affect the formation and growth of the baby.  Follow your caregiver's instructions regarding medicine use. There are medicines that are either safe or unsafe to take during pregnancy.  Exercise only as directed by your caregiver. Experiencing uterine cramps is a good sign to stop exercising.  Continue to eat regular, healthy meals.  Wear a good support bra for breast tenderness.  Do not use hot tubs, steam rooms, or saunas.  Wear your seat belt at all times when driving.  Avoid raw meat, uncooked cheese, cat litter boxes, and soil used by cats. These carry germs that can cause birth defects in the baby.  Take your prenatal vitamins.  Try taking a stool softener (if your caregiver approves) if you develop constipation. Eat more high-fiber foods, such as fresh vegetables or fruit and whole grains. Drink plenty of fluids to keep your urine clear or pale yellow.  Take warm sitz baths to soothe any pain or discomfort caused by hemorrhoids. Use hemorrhoid cream if your caregiver approves.  If you develop varicose veins, wear support hose. Elevate your feet for 15 minutes, 3 4 times a day. Limit salt in your diet.  Avoid heavy lifting, wear low heal shoes, and practice good posture.  Rest a lot with your legs elevated if you have leg cramps or low back pain.  Visit your dentist if you have not gone during your pregnancy. Use a soft toothbrush to brush your teeth and be gentle when you floss.  A sexual relationship may be continued unless your caregiver directs you otherwise.  Do not travel far distances unless it is absolutely necessary and only with the approval of your caregiver.  Take prenatal classes to understand, practice, and ask questions about the labor and delivery.  Make a trial run to the hospital.  Pack your hospital bag.  Prepare the baby's nursery.  Continue to go to all your prenatal visits as directed  by your caregiver. SEEK MEDICAL CARE IF:  You are unsure if you are in labor or if your water has broken.  You have dizziness.  You have mild pelvic cramps, pelvic pressure, or nagging pain in your abdominal area.  You have persistent nausea, vomiting, or diarrhea.  You have a bad smelling vaginal discharge.  You have pain with urination. SEEK IMMEDIATE MEDICAL CARE IF:   You have a fever.  You are leaking fluid from your vagina.  You have spotting or bleeding from your vagina.  You have severe abdominal cramping or pain.  You have rapid weight loss or gain.  You have shortness of breath with chest pain.  You notice sudden or extreme swelling of your face, hands, ankles, feet, or legs.  You have not felt your baby move in over an hour.  You have severe headaches that do not go away with medicine.  You have vision changes. Document Released: 08/29/2001 Document Revised: 05/07/2013 Document Reviewed:   11/05/2012 [employer] Patient Information 2014 [employer].  Breastfeeding Deciding to breastfeed is one of the best choices you can make for you and your baby. A change in hormones during pregnancy causes your breast tissue to grow and increases the number and size of your milk ducts. These hormones also allow proteins, sugars, and fats from your blood supply to make breast milk in your milk-producing glands. Hormones prevent breast milk from being released before your baby is born as well as prompt milk flow after birth. Once breastfeeding has begun, thoughts of your baby, as well as his or her sucking or crying, can stimulate the release of milk from your milk-producing glands.  BENEFITS OF BREASTFEEDING For Your Baby  Your first milk (colostrum) helps your baby's digestive system function better.   There are antibodies in your milk that help your baby fight off infections.   Your baby has a lower incidence of asthma, allergies, and sudden infant death syndrome.    The nutrients in breast milk are better for your baby than infant formulas and are designed uniquely for your baby's needs.   Breast milk improves your baby's brain development.   Your baby is less likely to develop other conditions, such as childhood obesity, asthma, or type 2 diabetes mellitus.  For You   Breastfeeding helps to create a very special bond between you and your baby.   Breastfeeding is convenient. Breast milk is always available at the correct temperature and costs nothing.   Breastfeeding helps to burn calories and helps you lose the weight gained during pregnancy.   Breastfeeding makes your uterus contract to its prepregnancy size faster and slows bleeding (lochia) after you give birth.   Breastfeeding helps to lower your risk of developing type 2 diabetes mellitus, osteoporosis, and breast or ovarian cancer later in life. SIGNS THAT YOUR BABY IS HUNGRY Early Signs of Hunger  Increased alertness or activity.  Stretching.  Movement of the head from side to side.  Movement of the head and opening of the mouth when the corner of the mouth or cheek is stroked (rooting).  Increased sucking sounds, smacking lips, cooing, sighing, or squeaking.  Hand-to-mouth movements.  Increased sucking of fingers or hands. Late Signs of Hunger  Fussing.  Intermittent crying. Extreme Signs of Hunger Signs of extreme hunger will require calming and consoling before your baby will be able to breastfeed successfully. Do not wait for the following signs of extreme hunger to occur before you initiate breastfeeding:   Restlessness.  A loud, strong cry.   Screaming. BREASTFEEDING BASICS Breastfeeding Initiation  Find a comfortable place to sit or lie down, with your neck and back well supported.  Place a pillow or rolled up blanket under your baby to bring him or her to the level of your breast (if you are seated). Nursing pillows are specially designed to help  support your arms and your baby while you breastfeed.  Make sure that your baby's abdomen is facing your abdomen.   Gently massage your breast. With your fingertips, massage from your chest wall toward your nipple in a circular motion. This encourages milk flow. You may need to continue this action during the feeding if your milk flows slowly.  Support your breast with 4 fingers underneath and your thumb above your nipple. Make sure your fingers are well away from your nipple and your baby's mouth.   Stroke your baby's lips gently with your finger or nipple.   When your baby's mouth is   open wide enough, quickly bring your baby to your breast, placing your entire nipple and as much of the colored area around your nipple (areola) as possible into your baby's mouth.   More areola should be visible above your baby's upper lip than below the lower lip.   Your baby's tongue should be between his or her lower gum and your breast.   Ensure that your baby's mouth is correctly positioned around your nipple (latched). Your baby's lips should create a seal on your breast and be turned out (everted).  It is common for your baby to suck about 2 3 minutes in order to start the flow of breast milk. Latching Teaching your baby how to latch on to your breast properly is very important. An improper latch can cause nipple pain and decreased milk supply for you and poor weight gain in your baby. Also, if your baby is not latched onto your nipple properly, he or she may swallow some air during feeding. This can make your baby fussy. Burping your baby when you switch breasts during the feeding can help to get rid of the air. However, teaching your baby to latch on properly is still the best way to prevent fussiness from swallowing air while breastfeeding. Signs that your baby has successfully latched on to your nipple:    Silent tugging or silent sucking, without causing you pain.   Swallowing heard  between every 3 4 sucks.    Muscle movement above and in front of his or her ears while sucking.  Signs that your baby has not successfully latched on to nipple:   Sucking sounds or smacking sounds from your baby while breastfeeding.  Nipple pain. If you think your baby has not latched on correctly, slip your finger into the corner of your baby's mouth to break the suction and place it between your baby's gums. Attempt breastfeeding initiation again. Signs of Successful Breastfeeding Signs from your baby:   A gradual decrease in the number of sucks or complete cessation of sucking.   Falling asleep.   Relaxation of his or her body.   Retention of a small amount of milk in his or her mouth.   Letting go of your breast by himself or herself. Signs from you:  Breasts that have increased in firmness, weight, and size 1 3 hours after feeding.   Breasts that are softer immediately after breastfeeding.  Increased milk volume, as well as a change in milk consistency and color by the 5th day of breastfeeding.   Nipples that are not sore, cracked, or bleeding. Signs That Your Baby is Getting Enough Milk  Wetting at least 3 diapers in a 24-hour period. The urine should be clear and pale yellow by age 5 days.  At least 3 stools in a 24-hour period by age 5 days. The stool should be soft and yellow.  At least 3 stools in a 24-hour period by age 7 days. The stool should be seedy and yellow.  No loss of weight greater than 10% of birth weight during the first 3 days of age.  Average weight gain of 4 7 ounces (120 210 mL) per week after age 4 days.  Consistent daily weight gain by age 5 days, without weight loss after the age of 2 weeks. After a feeding, your baby may spit up a small amount. This is common. BREASTFEEDING FREQUENCY AND DURATION Frequent feeding will help you make more milk and can prevent sore nipples and breast engorgement.   Breastfeed when you feel the need to  reduce the fullness of your breasts or when your baby shows signs of hunger. This is called "breastfeeding on demand." Avoid introducing a pacifier to your baby while you are working to establish breastfeeding (the first 4 6 weeks after your baby is born). After this time you may choose to use a pacifier. Research has shown that pacifier use during the first year of a baby's life decreases the risk of sudden infant death syndrome (SIDS). Allow your baby to feed on each breast as long as he or she wants. Breastfeed until your baby is finished feeding. When your baby unlatches or falls asleep while feeding from the first breast, offer the second breast. Because newborns are often sleepy in the first few weeks of life, you may need to awaken your baby to get him or her to feed. Breastfeeding times will vary from baby to baby. However, the following rules can serve as a guide to help you ensure that your baby is properly fed:  Newborns (babies 4 weeks of age or younger) may breastfeed every 1 3 hours.  Newborns should not go longer than 3 hours during the day or 5 hours during the night without breastfeeding.  You should breastfeed your baby a minimum of 8 times in a 24-hour period until you begin to introduce solid foods to your baby at around 6 months of age. BREAST MILK PUMPING Pumping and storing breast milk allows you to ensure that your baby is exclusively fed your breast milk, even at times when you are unable to breastfeed. This is especially important if you are going back to work while you are still breastfeeding or when you are not able to be present during feedings. Your lactation consultant can give you guidelines on how long it is safe to store breast milk.  A breast pump is a machine that allows you to pump milk from your breast into a sterile bottle. The pumped breast milk can then be stored in a refrigerator or freezer. Some breast pumps are operated by hand, while others use electricity. Ask  your lactation consultant which type will work best for you. Breast pumps can be purchased, but some hospitals and breastfeeding support groups lease breast pumps on a monthly basis. A lactation consultant can teach you how to hand express breast milk, if you prefer not to use a pump.  CARING FOR YOUR BREASTS WHILE YOU BREASTFEED Nipples can become dry, cracked, and sore while breastfeeding. The following recommendations can help keep your breasts moisturized and healthy:  Avoid using soap on your nipples.   Wear a supportive bra. Although not required, special nursing bras and tank tops are designed to allow access to your breasts for breastfeeding without taking off your entire bra or top. Avoid wearing underwire style bras or extremely tight bras.  Air dry your nipples for 3 4minutes after each feeding.   Use only cotton bra pads to absorb leaked breast milk. Leaking of breast milk between feedings is normal.   Use lanolin on your nipples after breastfeeding. Lanolin helps to maintain your skin's normal moisture barrier. If you use pure lanolin you do not need to wash it off before feeding your baby again. Pure lanolin is not toxic to your baby. You may also hand express a few drops of breast milk and gently massage that milk into your nipples and allow the milk to air dry. In the first few weeks after giving birth, some women   experience extremely full breasts (engorgement). Engorgement can make your breasts feel heavy, warm, and tender to the touch. Engorgement peaks within 3 5 days after you give birth. The following recommendations can help ease engorgement:  Completely empty your breasts while breastfeeding or pumping. You may want to start by applying warm, moist heat (in the shower or with warm water-soaked hand towels) just before feeding or pumping. This increases circulation and helps the milk flow. If your baby does not completely empty your breasts while breastfeeding, pump any extra  milk after he or she is finished.  Wear a snug bra (nursing or regular) or tank top for 1 2 days to signal your body to slightly decrease milk production.  Apply ice packs to your breasts, unless this is too uncomfortable for you.  Make sure that your baby is latched on and positioned properly while breastfeeding. If engorgement persists after 48 hours of following these recommendations, contact your health care provider or a lactation consultant. OVERALL HEALTH CARE RECOMMENDATIONS WHILE BREASTFEEDING  Eat healthy foods. Alternate between meals and snacks, eating 3 of each per day. Because what you eat affects your breast milk, some of the foods may make your baby more irritable than usual. Avoid eating these foods if you are sure that they are negatively affecting your baby.  Drink milk, fruit juice, and water to satisfy your thirst (about 10 glasses a day).   Rest often, relax, and continue to take your prenatal vitamins to prevent fatigue, stress, and anemia.  Continue breast self-awareness checks.  Avoid chewing and smoking tobacco.  Avoid alcohol and drug use. Some medicines that may be harmful to your baby can pass through breast milk. It is important to ask your health care provider before taking any medicine, including all over-the-counter and prescription medicine as well as vitamin and herbal supplements. It is possible to become pregnant while breastfeeding. If birth control is desired, ask your health care provider about options that will be safe for your baby. SEEK MEDICAL CARE IF:   You feel like you want to stop breastfeeding or have become frustrated with breastfeeding.  You have painful breasts or nipples.  Your nipples are cracked or bleeding.  Your breasts are red, tender, or warm.  You have a swollen area on either breast.  You have a fever or chills.  You have nausea or vomiting.  You have drainage other than breast milk from your nipples.  Your breasts  do not become full before feedings by the 5th day after you give birth.  You feel sad and depressed.  Your baby is too sleepy to eat well.  Your baby is having trouble sleeping.   Your baby is wetting less than 3 diapers in a 24-hour period.  Your baby has less than 3 stools in a 24-hour period.  Your baby's skin or the white part of his or her eyes becomes yellow.   Your baby is not gaining weight by 5 days of age. SEEK IMMEDIATE MEDICAL CARE IF:   Your baby is overly tired (lethargic) and does not want to wake up and feed.  Your baby develops an unexplained fever. Document Released: 09/04/2005 Document Revised: 05/07/2013 Document Reviewed: 02/26/2013 [employer] Patient Information 2014 [employer].  

## 2014-02-13 ENCOUNTER — Ambulatory Visit (INDEPENDENT_AMBULATORY_CARE_PROVIDER_SITE_OTHER): Payer: Managed Care, Other (non HMO) | Admitting: Family Medicine

## 2014-02-13 VITALS — BP 123/78 | HR 94 | Wt 274.0 lb

## 2014-02-13 DIAGNOSIS — Z3480 Encounter for supervision of other normal pregnancy, unspecified trimester: Secondary | ICD-10-CM

## 2014-02-13 DIAGNOSIS — O0940 Supervision of pregnancy with grand multiparity, unspecified trimester: Principal | ICD-10-CM

## 2014-02-13 NOTE — Patient Instructions (Signed)
Third Trimester of Pregnancy The third trimester is from week 29 through week 42, months 7 through 9. The third trimester is a time when the fetus is growing rapidly. At the end of the ninth month, the fetus is about 20 inches in length and weighs 6 10 pounds.  BODY CHANGES Your body goes through many changes during pregnancy. The changes vary from woman to woman.   Your weight will continue to increase. You can expect to gain 25 35 pounds (11 16 kg) by the end of the pregnancy.  You may begin to get stretch marks on your hips, abdomen, and breasts.  You may urinate more often because the fetus is moving lower into your pelvis and pressing on your bladder.  You may develop or continue to have heartburn as a result of your pregnancy.  You may develop constipation because certain hormones are causing the muscles that push waste through your intestines to slow down.  You may develop hemorrhoids or swollen, bulging veins (varicose veins).  You may have pelvic pain because of the weight gain and pregnancy hormones relaxing your joints between the bones in your pelvis. Back aches may result from over exertion of the muscles supporting your posture.  Your breasts will continue to grow and be tender. A yellow discharge may leak from your breasts called colostrum.  Your belly button may stick out.  You may feel short of breath because of your expanding uterus.  You may notice the fetus "dropping," or moving lower in your abdomen.  You may have a bloody mucus discharge. This usually occurs a few days to a week before labor begins.  Your cervix becomes thin and soft (effaced) near your due date. WHAT TO EXPECT AT YOUR PRENATAL EXAMS  You will have prenatal exams every 2 weeks until week 36. Then, you will have weekly prenatal exams. During a routine prenatal visit:  You will be weighed to make sure you and the fetus are growing normally.  Your blood pressure is taken.  Your abdomen will  be measured to track your baby's growth.  The fetal heartbeat will be listened to.  Any test results from the previous visit will be discussed.  You may have a cervical check near your due date to see if you have effaced. At around 36 weeks, your caregiver will check your cervix. At the same time, your caregiver will also perform a test on the secretions of the vaginal tissue. This test is to determine if a type of bacteria, Group B streptococcus, is present. Your caregiver will explain this further. Your caregiver may ask you:  What your birth plan is.  How you are feeling.  If you are feeling the baby move.  If you have had any abnormal symptoms, such as leaking fluid, bleeding, severe headaches, or abdominal cramping.  If you have any questions. Other tests or screenings that may be performed during your third trimester include:  Blood tests that check for low iron levels (anemia).  Fetal testing to check the health, activity level, and growth of the fetus. Testing is done if you have certain medical conditions or if there are problems during the pregnancy. FALSE LABOR You may feel small, irregular contractions that eventually go away. These are called Braxton Hicks contractions, or false labor. Contractions may last for hours, days, or even weeks before true labor sets in. If contractions come at regular intervals, intensify, or become painful, it is best to be seen by your caregiver.    SIGNS OF LABOR   Menstrual-like cramps.  Contractions that are 5 minutes apart or less.  Contractions that start on the top of the uterus and spread down to the lower abdomen and back.  A sense of increased pelvic pressure or back pain.  A watery or bloody mucus discharge that comes from the vagina. If you have any of these signs before the 37th week of pregnancy, call your caregiver right away. You need to go to the hospital to get checked immediately. HOME CARE INSTRUCTIONS   Avoid all  smoking, herbs, alcohol, and unprescribed drugs. These chemicals affect the formation and growth of the baby.  Follow your caregiver's instructions regarding medicine use. There are medicines that are either safe or unsafe to take during pregnancy.  Exercise only as directed by your caregiver. Experiencing uterine cramps is a good sign to stop exercising.  Continue to eat regular, healthy meals.  Wear a good support bra for breast tenderness.  Do not use hot tubs, steam rooms, or saunas.  Wear your seat belt at all times when driving.  Avoid raw meat, uncooked cheese, cat litter boxes, and soil used by cats. These carry germs that can cause birth defects in the baby.  Take your prenatal vitamins.  Try taking a stool softener (if your caregiver approves) if you develop constipation. Eat more high-fiber foods, such as fresh vegetables or fruit and whole grains. Drink plenty of fluids to keep your urine clear or pale yellow.  Take warm sitz baths to soothe any pain or discomfort caused by hemorrhoids. Use hemorrhoid cream if your caregiver approves.  If you develop varicose veins, wear support hose. Elevate your feet for 15 minutes, 3 4 times a day. Limit salt in your diet.  Avoid heavy lifting, wear low heal shoes, and practice good posture.  Rest a lot with your legs elevated if you have leg cramps or low back pain.  Visit your dentist if you have not gone during your pregnancy. Use a soft toothbrush to brush your teeth and be gentle when you floss.  A sexual relationship may be continued unless your caregiver directs you otherwise.  Do not travel far distances unless it is absolutely necessary and only with the approval of your caregiver.  Take prenatal classes to understand, practice, and ask questions about the labor and delivery.  Make a trial run to the hospital.  Pack your hospital bag.  Prepare the baby's nursery.  Continue to go to all your prenatal visits as directed  by your caregiver. SEEK MEDICAL CARE IF:  You are unsure if you are in labor or if your water has broken.  You have dizziness.  You have mild pelvic cramps, pelvic pressure, or nagging pain in your abdominal area.  You have persistent nausea, vomiting, or diarrhea.  You have a bad smelling vaginal discharge.  You have pain with urination. SEEK IMMEDIATE MEDICAL CARE IF:   You have a fever.  You are leaking fluid from your vagina.  You have spotting or bleeding from your vagina.  You have severe abdominal cramping or pain.  You have rapid weight loss or gain.  You have shortness of breath with chest pain.  You notice sudden or extreme swelling of your face, hands, ankles, feet, or legs.  You have not felt your baby move in over an hour.  You have severe headaches that do not go away with medicine.  You have vision changes. Document Released: 08/29/2001 Document Revised: 05/07/2013 Document Reviewed:   11/05/2012 [employer] Patient Information 2014 [employer].  Breastfeeding Deciding to breastfeed is one of the best choices you can make for you and your baby. A change in hormones during pregnancy causes your breast tissue to grow and increases the number and size of your milk ducts. These hormones also allow proteins, sugars, and fats from your blood supply to make breast milk in your milk-producing glands. Hormones prevent breast milk from being released before your baby is born as well as prompt milk flow after birth. Once breastfeeding has begun, thoughts of your baby, as well as his or her sucking or crying, can stimulate the release of milk from your milk-producing glands.  BENEFITS OF BREASTFEEDING For Your Baby  Your first milk (colostrum) helps your baby's digestive system function better.   There are antibodies in your milk that help your baby fight off infections.   Your baby has a lower incidence of asthma, allergies, and sudden infant death syndrome.    The nutrients in breast milk are better for your baby than infant formulas and are designed uniquely for your baby's needs.   Breast milk improves your baby's brain development.   Your baby is less likely to develop other conditions, such as childhood obesity, asthma, or type 2 diabetes mellitus.  For You   Breastfeeding helps to create a very special bond between you and your baby.   Breastfeeding is convenient. Breast milk is always available at the correct temperature and costs nothing.   Breastfeeding helps to burn calories and helps you lose the weight gained during pregnancy.   Breastfeeding makes your uterus contract to its prepregnancy size faster and slows bleeding (lochia) after you give birth.   Breastfeeding helps to lower your risk of developing type 2 diabetes mellitus, osteoporosis, and breast or ovarian cancer later in life. SIGNS THAT YOUR BABY IS HUNGRY Early Signs of Hunger  Increased alertness or activity.  Stretching.  Movement of the head from side to side.  Movement of the head and opening of the mouth when the corner of the mouth or cheek is stroked (rooting).  Increased sucking sounds, smacking lips, cooing, sighing, or squeaking.  Hand-to-mouth movements.  Increased sucking of fingers or hands. Late Signs of Hunger  Fussing.  Intermittent crying. Extreme Signs of Hunger Signs of extreme hunger will require calming and consoling before your baby will be able to breastfeed successfully. Do not wait for the following signs of extreme hunger to occur before you initiate breastfeeding:   Restlessness.  A loud, strong cry.   Screaming. BREASTFEEDING BASICS Breastfeeding Initiation  Find a comfortable place to sit or lie down, with your neck and back well supported.  Place a pillow or rolled up blanket under your baby to bring him or her to the level of your breast (if you are seated). Nursing pillows are specially designed to help  support your arms and your baby while you breastfeed.  Make sure that your baby's abdomen is facing your abdomen.   Gently massage your breast. With your fingertips, massage from your chest wall toward your nipple in a circular motion. This encourages milk flow. You may need to continue this action during the feeding if your milk flows slowly.  Support your breast with 4 fingers underneath and your thumb above your nipple. Make sure your fingers are well away from your nipple and your baby's mouth.   Stroke your baby's lips gently with your finger or nipple.   When your baby's mouth is   open wide enough, quickly bring your baby to your breast, placing your entire nipple and as much of the colored area around your nipple (areola) as possible into your baby's mouth.   More areola should be visible above your baby's upper lip than below the lower lip.   Your baby's tongue should be between his or her lower gum and your breast.   Ensure that your baby's mouth is correctly positioned around your nipple (latched). Your baby's lips should create a seal on your breast and be turned out (everted).  It is common for your baby to suck about 2 3 minutes in order to start the flow of breast milk. Latching Teaching your baby how to latch on to your breast properly is very important. An improper latch can cause nipple pain and decreased milk supply for you and poor weight gain in your baby. Also, if your baby is not latched onto your nipple properly, he or she may swallow some air during feeding. This can make your baby fussy. Burping your baby when you switch breasts during the feeding can help to get rid of the air. However, teaching your baby to latch on properly is still the best way to prevent fussiness from swallowing air while breastfeeding. Signs that your baby has successfully latched on to your nipple:    Silent tugging or silent sucking, without causing you pain.   Swallowing heard  between every 3 4 sucks.    Muscle movement above and in front of his or her ears while sucking.  Signs that your baby has not successfully latched on to nipple:   Sucking sounds or smacking sounds from your baby while breastfeeding.  Nipple pain. If you think your baby has not latched on correctly, slip your finger into the corner of your baby's mouth to break the suction and place it between your baby's gums. Attempt breastfeeding initiation again. Signs of Successful Breastfeeding Signs from your baby:   A gradual decrease in the number of sucks or complete cessation of sucking.   Falling asleep.   Relaxation of his or her body.   Retention of a small amount of milk in his or her mouth.   Letting go of your breast by himself or herself. Signs from you:  Breasts that have increased in firmness, weight, and size 1 3 hours after feeding.   Breasts that are softer immediately after breastfeeding.  Increased milk volume, as well as a change in milk consistency and color by the 5th day of breastfeeding.   Nipples that are not sore, cracked, or bleeding. Signs That Your Baby is Getting Enough Milk  Wetting at least 3 diapers in a 24-hour period. The urine should be clear and pale yellow by age 5 days.  At least 3 stools in a 24-hour period by age 5 days. The stool should be soft and yellow.  At least 3 stools in a 24-hour period by age 7 days. The stool should be seedy and yellow.  No loss of weight greater than 10% of birth weight during the first 3 days of age.  Average weight gain of 4 7 ounces (120 210 mL) per week after age 4 days.  Consistent daily weight gain by age 5 days, without weight loss after the age of 2 weeks. After a feeding, your baby may spit up a small amount. This is common. BREASTFEEDING FREQUENCY AND DURATION Frequent feeding will help you make more milk and can prevent sore nipples and breast engorgement.   Breastfeed when you feel the need to  reduce the fullness of your breasts or when your baby shows signs of hunger. This is called "breastfeeding on demand." Avoid introducing a pacifier to your baby while you are working to establish breastfeeding (the first 4 6 weeks after your baby is born). After this time you may choose to use a pacifier. Research has shown that pacifier use during the first year of a baby's life decreases the risk of sudden infant death syndrome (SIDS). Allow your baby to feed on each breast as long as he or she wants. Breastfeed until your baby is finished feeding. When your baby unlatches or falls asleep while feeding from the first breast, offer the second breast. Because newborns are often sleepy in the first few weeks of life, you may need to awaken your baby to get him or her to feed. Breastfeeding times will vary from baby to baby. However, the following rules can serve as a guide to help you ensure that your baby is properly fed:  Newborns (babies 4 weeks of age or younger) may breastfeed every 1 3 hours.  Newborns should not go longer than 3 hours during the day or 5 hours during the night without breastfeeding.  You should breastfeed your baby a minimum of 8 times in a 24-hour period until you begin to introduce solid foods to your baby at around 6 months of age. BREAST MILK PUMPING Pumping and storing breast milk allows you to ensure that your baby is exclusively fed your breast milk, even at times when you are unable to breastfeed. This is especially important if you are going back to work while you are still breastfeeding or when you are not able to be present during feedings. Your lactation consultant can give you guidelines on how long it is safe to store breast milk.  A breast pump is a machine that allows you to pump milk from your breast into a sterile bottle. The pumped breast milk can then be stored in a refrigerator or freezer. Some breast pumps are operated by hand, while others use electricity. Ask  your lactation consultant which type will work best for you. Breast pumps can be purchased, but some hospitals and breastfeeding support groups lease breast pumps on a monthly basis. A lactation consultant can teach you how to hand express breast milk, if you prefer not to use a pump.  CARING FOR YOUR BREASTS WHILE YOU BREASTFEED Nipples can become dry, cracked, and sore while breastfeeding. The following recommendations can help keep your breasts moisturized and healthy:  Avoid using soap on your nipples.   Wear a supportive bra. Although not required, special nursing bras and tank tops are designed to allow access to your breasts for breastfeeding without taking off your entire bra or top. Avoid wearing underwire style bras or extremely tight bras.  Air dry your nipples for 3 4minutes after each feeding.   Use only cotton bra pads to absorb leaked breast milk. Leaking of breast milk between feedings is normal.   Use lanolin on your nipples after breastfeeding. Lanolin helps to maintain your skin's normal moisture barrier. If you use pure lanolin you do not need to wash it off before feeding your baby again. Pure lanolin is not toxic to your baby. You may also hand express a few drops of breast milk and gently massage that milk into your nipples and allow the milk to air dry. In the first few weeks after giving birth, some women   experience extremely full breasts (engorgement). Engorgement can make your breasts feel heavy, warm, and tender to the touch. Engorgement peaks within 3 5 days after you give birth. The following recommendations can help ease engorgement:  Completely empty your breasts while breastfeeding or pumping. You may want to start by applying warm, moist heat (in the shower or with warm water-soaked hand towels) just before feeding or pumping. This increases circulation and helps the milk flow. If your baby does not completely empty your breasts while breastfeeding, pump any extra  milk after he or she is finished.  Wear a snug bra (nursing or regular) or tank top for 1 2 days to signal your body to slightly decrease milk production.  Apply ice packs to your breasts, unless this is too uncomfortable for you.  Make sure that your baby is latched on and positioned properly while breastfeeding. If engorgement persists after 48 hours of following these recommendations, contact your health care provider or a lactation consultant. OVERALL HEALTH CARE RECOMMENDATIONS WHILE BREASTFEEDING  Eat healthy foods. Alternate between meals and snacks, eating 3 of each per day. Because what you eat affects your breast milk, some of the foods may make your baby more irritable than usual. Avoid eating these foods if you are sure that they are negatively affecting your baby.  Drink milk, fruit juice, and water to satisfy your thirst (about 10 glasses a day).   Rest often, relax, and continue to take your prenatal vitamins to prevent fatigue, stress, and anemia.  Continue breast self-awareness checks.  Avoid chewing and smoking tobacco.  Avoid alcohol and drug use. Some medicines that may be harmful to your baby can pass through breast milk. It is important to ask your health care provider before taking any medicine, including all over-the-counter and prescription medicine as well as vitamin and herbal supplements. It is possible to become pregnant while breastfeeding. If birth control is desired, ask your health care provider about options that will be safe for your baby. SEEK MEDICAL CARE IF:   You feel like you want to stop breastfeeding or have become frustrated with breastfeeding.  You have painful breasts or nipples.  Your nipples are cracked or bleeding.  Your breasts are red, tender, or warm.  You have a swollen area on either breast.  You have a fever or chills.  You have nausea or vomiting.  You have drainage other than breast milk from your nipples.  Your breasts  do not become full before feedings by the 5th day after you give birth.  You feel sad and depressed.  Your baby is too sleepy to eat well.  Your baby is having trouble sleeping.   Your baby is wetting less than 3 diapers in a 24-hour period.  Your baby has less than 3 stools in a 24-hour period.  Your baby's skin or the white part of his or her eyes becomes yellow.   Your baby is not gaining weight by 5 days of age. SEEK IMMEDIATE MEDICAL CARE IF:   Your baby is overly tired (lethargic) and does not want to wake up and feed.  Your baby develops an unexplained fever. Document Released: 09/04/2005 Document Revised: 05/07/2013 Document Reviewed: 02/26/2013 [employer] Patient Information 2014 [employer].  

## 2014-02-13 NOTE — Progress Notes (Signed)
Doing well Some braxton hicks contractions.

## 2014-03-03 ENCOUNTER — Encounter: Payer: Self-pay | Admitting: Family Medicine

## 2014-03-03 ENCOUNTER — Ambulatory Visit (INDEPENDENT_AMBULATORY_CARE_PROVIDER_SITE_OTHER): Payer: Managed Care, Other (non HMO) | Admitting: Family Medicine

## 2014-03-03 VITALS — BP 118/64 | HR 86 | Wt 274.6 lb

## 2014-03-03 DIAGNOSIS — Z3480 Encounter for supervision of other normal pregnancy, unspecified trimester: Secondary | ICD-10-CM

## 2014-03-03 DIAGNOSIS — O0940 Supervision of pregnancy with grand multiparity, unspecified trimester: Principal | ICD-10-CM

## 2014-03-03 LAB — OB RESULTS CONSOLE GC/CHLAMYDIA
CHLAMYDIA, DNA PROBE: NEGATIVE
Gonorrhea: NEGATIVE

## 2014-03-03 NOTE — Progress Notes (Signed)
Doing well--no complaints Cultures today

## 2014-03-03 NOTE — Patient Instructions (Signed)
Third Trimester of Pregnancy The third trimester is from week 29 through week 42, months 7 through 9. The third trimester is a time when the fetus is growing rapidly. At the end of the ninth month, the fetus is about 20 inches in length and weighs 6 10 pounds.  BODY CHANGES Your body goes through many changes during pregnancy. The changes vary from woman to woman.   Your weight will continue to increase. You can expect to gain 25 35 pounds (11 16 kg) by the end of the pregnancy.  You may begin to get stretch marks on your hips, abdomen, and breasts.  You may urinate more often because the fetus is moving lower into your pelvis and pressing on your bladder.  You may develop or continue to have heartburn as a result of your pregnancy.  You may develop constipation because certain hormones are causing the muscles that push waste through your intestines to slow down.  You may develop hemorrhoids or swollen, bulging veins (varicose veins).  You may have pelvic pain because of the weight gain and pregnancy hormones relaxing your joints between the bones in your pelvis. Back aches may result from over exertion of the muscles supporting your posture.  Your breasts will continue to grow and be tender. A yellow discharge may leak from your breasts called colostrum.  Your belly button may stick out.  You may feel short of breath because of your expanding uterus.  You may notice the fetus "dropping," or moving lower in your abdomen.  You may have a bloody mucus discharge. This usually occurs a few days to a week before labor begins.  Your cervix becomes thin and soft (effaced) near your due date. WHAT TO EXPECT AT YOUR PRENATAL EXAMS  You will have prenatal exams every 2 weeks until week 36. Then, you will have weekly prenatal exams. During a routine prenatal visit:  You will be weighed to make sure you and the fetus are growing normally.  Your blood pressure is taken.  Your abdomen will  be measured to track your baby's growth.  The fetal heartbeat will be listened to.  Any test results from the previous visit will be discussed.  You may have a cervical check near your due date to see if you have effaced. At around 36 weeks, your caregiver will check your cervix. At the same time, your caregiver will also perform a test on the secretions of the vaginal tissue. This test is to determine if a type of bacteria, Group B streptococcus, is present. Your caregiver will explain this further. Your caregiver may ask you:  What your birth plan is.  How you are feeling.  If you are feeling the baby move.  If you have had any abnormal symptoms, such as leaking fluid, bleeding, severe headaches, or abdominal cramping.  If you have any questions. Other tests or screenings that may be performed during your third trimester include:  Blood tests that check for low iron levels (anemia).  Fetal testing to check the health, activity level, and growth of the fetus. Testing is done if you have certain medical conditions or if there are problems during the pregnancy. FALSE LABOR You may feel small, irregular contractions that eventually go away. These are called Braxton Hicks contractions, or false labor. Contractions may last for hours, days, or even weeks before true labor sets in. If contractions come at regular intervals, intensify, or become painful, it is best to be seen by your caregiver.    SIGNS OF LABOR   Menstrual-like cramps.  Contractions that are 5 minutes apart or less.  Contractions that start on the top of the uterus and spread down to the lower abdomen and back.  A sense of increased pelvic pressure or back pain.  A watery or bloody mucus discharge that comes from the vagina. If you have any of these signs before the 37th week of pregnancy, call your caregiver right away. You need to go to the hospital to get checked immediately. HOME CARE INSTRUCTIONS   Avoid all  smoking, herbs, alcohol, and unprescribed drugs. These chemicals affect the formation and growth of the baby.  Follow your caregiver's instructions regarding medicine use. There are medicines that are either safe or unsafe to take during pregnancy.  Exercise only as directed by your caregiver. Experiencing uterine cramps is a good sign to stop exercising.  Continue to eat regular, healthy meals.  Wear a good support bra for breast tenderness.  Do not use hot tubs, steam rooms, or saunas.  Wear your seat belt at all times when driving.  Avoid raw meat, uncooked cheese, cat litter boxes, and soil used by cats. These carry germs that can cause birth defects in the baby.  Take your prenatal vitamins.  Try taking a stool softener (if your caregiver approves) if you develop constipation. Eat more high-fiber foods, such as fresh vegetables or fruit and whole grains. Drink plenty of fluids to keep your urine clear or pale yellow.  Take warm sitz baths to soothe any pain or discomfort caused by hemorrhoids. Use hemorrhoid cream if your caregiver approves.  If you develop varicose veins, wear support hose. Elevate your feet for 15 minutes, 3 4 times a day. Limit salt in your diet.  Avoid heavy lifting, wear low heal shoes, and practice good posture.  Rest a lot with your legs elevated if you have leg cramps or low back pain.  Visit your dentist if you have not gone during your pregnancy. Use a soft toothbrush to brush your teeth and be gentle when you floss.  A sexual relationship may be continued unless your caregiver directs you otherwise.  Do not travel far distances unless it is absolutely necessary and only with the approval of your caregiver.  Take prenatal classes to understand, practice, and ask questions about the labor and delivery.  Make a trial run to the hospital.  Pack your hospital bag.  Prepare the baby's nursery.  Continue to go to all your prenatal visits as directed  by your caregiver. SEEK MEDICAL CARE IF:  You are unsure if you are in labor or if your water has broken.  You have dizziness.  You have mild pelvic cramps, pelvic pressure, or nagging pain in your abdominal area.  You have persistent nausea, vomiting, or diarrhea.  You have a bad smelling vaginal discharge.  You have pain with urination. SEEK IMMEDIATE MEDICAL CARE IF:   You have a fever.  You are leaking fluid from your vagina.  You have spotting or bleeding from your vagina.  You have severe abdominal cramping or pain.  You have rapid weight loss or gain.  You have shortness of breath with chest pain.  You notice sudden or extreme swelling of your face, hands, ankles, feet, or legs.  You have not felt your baby move in over an hour.  You have severe headaches that do not go away with medicine.  You have vision changes. Document Released: 08/29/2001 Document Revised: 05/07/2013 Document Reviewed:   11/05/2012 [employer] Patient Information 2014 [employer].  Breastfeeding Deciding to breastfeed is one of the best choices you can make for you and your baby. A change in hormones during pregnancy causes your breast tissue to grow and increases the number and size of your milk ducts. These hormones also allow proteins, sugars, and fats from your blood supply to make breast milk in your milk-producing glands. Hormones prevent breast milk from being released before your baby is born as well as prompt milk flow after birth. Once breastfeeding has begun, thoughts of your baby, as well as his or her sucking or crying, can stimulate the release of milk from your milk-producing glands.  BENEFITS OF BREASTFEEDING For Your Baby  Your first milk (colostrum) helps your baby's digestive system function better.   There are antibodies in your milk that help your baby fight off infections.   Your baby has a lower incidence of asthma, allergies, and sudden infant death syndrome.    The nutrients in breast milk are better for your baby than infant formulas and are designed uniquely for your baby's needs.   Breast milk improves your baby's brain development.   Your baby is less likely to develop other conditions, such as childhood obesity, asthma, or type 2 diabetes mellitus.  For You   Breastfeeding helps to create a very special bond between you and your baby.   Breastfeeding is convenient. Breast milk is always available at the correct temperature and costs nothing.   Breastfeeding helps to burn calories and helps you lose the weight gained during pregnancy.   Breastfeeding makes your uterus contract to its prepregnancy size faster and slows bleeding (lochia) after you give birth.   Breastfeeding helps to lower your risk of developing type 2 diabetes mellitus, osteoporosis, and breast or ovarian cancer later in life. SIGNS THAT YOUR BABY IS HUNGRY Early Signs of Hunger  Increased alertness or activity.  Stretching.  Movement of the head from side to side.  Movement of the head and opening of the mouth when the corner of the mouth or cheek is stroked (rooting).  Increased sucking sounds, smacking lips, cooing, sighing, or squeaking.  Hand-to-mouth movements.  Increased sucking of fingers or hands. Late Signs of Hunger  Fussing.  Intermittent crying. Extreme Signs of Hunger Signs of extreme hunger will require calming and consoling before your baby will be able to breastfeed successfully. Do not wait for the following signs of extreme hunger to occur before you initiate breastfeeding:   Restlessness.  A loud, strong cry.   Screaming. BREASTFEEDING BASICS Breastfeeding Initiation  Find a comfortable place to sit or lie down, with your neck and back well supported.  Place a pillow or rolled up blanket under your baby to bring him or her to the level of your breast (if you are seated). Nursing pillows are specially designed to help  support your arms and your baby while you breastfeed.  Make sure that your baby's abdomen is facing your abdomen.   Gently massage your breast. With your fingertips, massage from your chest wall toward your nipple in a circular motion. This encourages milk flow. You may need to continue this action during the feeding if your milk flows slowly.  Support your breast with 4 fingers underneath and your thumb above your nipple. Make sure your fingers are well away from your nipple and your baby's mouth.   Stroke your baby's lips gently with your finger or nipple.   When your baby's mouth is   open wide enough, quickly bring your baby to your breast, placing your entire nipple and as much of the colored area around your nipple (areola) as possible into your baby's mouth.   More areola should be visible above your baby's upper lip than below the lower lip.   Your baby's tongue should be between his or her lower gum and your breast.   Ensure that your baby's mouth is correctly positioned around your nipple (latched). Your baby's lips should create a seal on your breast and be turned out (everted).  It is common for your baby to suck about 2 3 minutes in order to start the flow of breast milk. Latching Teaching your baby how to latch on to your breast properly is very important. An improper latch can cause nipple pain and decreased milk supply for you and poor weight gain in your baby. Also, if your baby is not latched onto your nipple properly, he or she may swallow some air during feeding. This can make your baby fussy. Burping your baby when you switch breasts during the feeding can help to get rid of the air. However, teaching your baby to latch on properly is still the best way to prevent fussiness from swallowing air while breastfeeding. Signs that your baby has successfully latched on to your nipple:    Silent tugging or silent sucking, without causing you pain.   Swallowing heard  between every 3 4 sucks.    Muscle movement above and in front of his or her ears while sucking.  Signs that your baby has not successfully latched on to nipple:   Sucking sounds or smacking sounds from your baby while breastfeeding.  Nipple pain. If you think your baby has not latched on correctly, slip your finger into the corner of your baby's mouth to break the suction and place it between your baby's gums. Attempt breastfeeding initiation again. Signs of Successful Breastfeeding Signs from your baby:   A gradual decrease in the number of sucks or complete cessation of sucking.   Falling asleep.   Relaxation of his or her body.   Retention of a small amount of milk in his or her mouth.   Letting go of your breast by himself or herself. Signs from you:  Breasts that have increased in firmness, weight, and size 1 3 hours after feeding.   Breasts that are softer immediately after breastfeeding.  Increased milk volume, as well as a change in milk consistency and color by the 5th day of breastfeeding.   Nipples that are not sore, cracked, or bleeding. Signs That Your Baby is Getting Enough Milk  Wetting at least 3 diapers in a 24-hour period. The urine should be clear and pale yellow by age 5 days.  At least 3 stools in a 24-hour period by age 5 days. The stool should be soft and yellow.  At least 3 stools in a 24-hour period by age 7 days. The stool should be seedy and yellow.  No loss of weight greater than 10% of birth weight during the first 3 days of age.  Average weight gain of 4 7 ounces (120 210 mL) per week after age 4 days.  Consistent daily weight gain by age 5 days, without weight loss after the age of 2 weeks. After a feeding, your baby may spit up a small amount. This is common. BREASTFEEDING FREQUENCY AND DURATION Frequent feeding will help you make more milk and can prevent sore nipples and breast engorgement.   Breastfeed when you feel the need to  reduce the fullness of your breasts or when your baby shows signs of hunger. This is called "breastfeeding on demand." Avoid introducing a pacifier to your baby while you are working to establish breastfeeding (the first 4 6 weeks after your baby is born). After this time you may choose to use a pacifier. Research has shown that pacifier use during the first year of a baby's life decreases the risk of sudden infant death syndrome (SIDS). Allow your baby to feed on each breast as long as he or she wants. Breastfeed until your baby is finished feeding. When your baby unlatches or falls asleep while feeding from the first breast, offer the second breast. Because newborns are often sleepy in the first few weeks of life, you may need to awaken your baby to get him or her to feed. Breastfeeding times will vary from baby to baby. However, the following rules can serve as a guide to help you ensure that your baby is properly fed:  Newborns (babies 4 weeks of age or younger) may breastfeed every 1 3 hours.  Newborns should not go longer than 3 hours during the day or 5 hours during the night without breastfeeding.  You should breastfeed your baby a minimum of 8 times in a 24-hour period until you begin to introduce solid foods to your baby at around 6 months of age. BREAST MILK PUMPING Pumping and storing breast milk allows you to ensure that your baby is exclusively fed your breast milk, even at times when you are unable to breastfeed. This is especially important if you are going back to work while you are still breastfeeding or when you are not able to be present during feedings. Your lactation consultant can give you guidelines on how long it is safe to store breast milk.  A breast pump is a machine that allows you to pump milk from your breast into a sterile bottle. The pumped breast milk can then be stored in a refrigerator or freezer. Some breast pumps are operated by hand, while others use electricity. Ask  your lactation consultant which type will work best for you. Breast pumps can be purchased, but some hospitals and breastfeeding support groups lease breast pumps on a monthly basis. A lactation consultant can teach you how to hand express breast milk, if you prefer not to use a pump.  CARING FOR YOUR BREASTS WHILE YOU BREASTFEED Nipples can become dry, cracked, and sore while breastfeeding. The following recommendations can help keep your breasts moisturized and healthy:  Avoid using soap on your nipples.   Wear a supportive bra. Although not required, special nursing bras and tank tops are designed to allow access to your breasts for breastfeeding without taking off your entire bra or top. Avoid wearing underwire style bras or extremely tight bras.  Air dry your nipples for 3 4minutes after each feeding.   Use only cotton bra pads to absorb leaked breast milk. Leaking of breast milk between feedings is normal.   Use lanolin on your nipples after breastfeeding. Lanolin helps to maintain your skin's normal moisture barrier. If you use pure lanolin you do not need to wash it off before feeding your baby again. Pure lanolin is not toxic to your baby. You may also hand express a few drops of breast milk and gently massage that milk into your nipples and allow the milk to air dry. In the first few weeks after giving birth, some women   experience extremely full breasts (engorgement). Engorgement can make your breasts feel heavy, warm, and tender to the touch. Engorgement peaks within 3 5 days after you give birth. The following recommendations can help ease engorgement:  Completely empty your breasts while breastfeeding or pumping. You may want to start by applying warm, moist heat (in the shower or with warm water-soaked hand towels) just before feeding or pumping. This increases circulation and helps the milk flow. If your baby does not completely empty your breasts while breastfeeding, pump any extra  milk after he or she is finished.  Wear a snug bra (nursing or regular) or tank top for 1 2 days to signal your body to slightly decrease milk production.  Apply ice packs to your breasts, unless this is too uncomfortable for you.  Make sure that your baby is latched on and positioned properly while breastfeeding. If engorgement persists after 48 hours of following these recommendations, contact your health care provider or a lactation consultant. OVERALL HEALTH CARE RECOMMENDATIONS WHILE BREASTFEEDING  Eat healthy foods. Alternate between meals and snacks, eating 3 of each per day. Because what you eat affects your breast milk, some of the foods may make your baby more irritable than usual. Avoid eating these foods if you are sure that they are negatively affecting your baby.  Drink milk, fruit juice, and water to satisfy your thirst (about 10 glasses a day).   Rest often, relax, and continue to take your prenatal vitamins to prevent fatigue, stress, and anemia.  Continue breast self-awareness checks.  Avoid chewing and smoking tobacco.  Avoid alcohol and drug use. Some medicines that may be harmful to your baby can pass through breast milk. It is important to ask your health care provider before taking any medicine, including all over-the-counter and prescription medicine as well as vitamin and herbal supplements. It is possible to become pregnant while breastfeeding. If birth control is desired, ask your health care provider about options that will be safe for your baby. SEEK MEDICAL CARE IF:   You feel like you want to stop breastfeeding or have become frustrated with breastfeeding.  You have painful breasts or nipples.  Your nipples are cracked or bleeding.  Your breasts are red, tender, or warm.  You have a swollen area on either breast.  You have a fever or chills.  You have nausea or vomiting.  You have drainage other than breast milk from your nipples.  Your breasts  do not become full before feedings by the 5th day after you give birth.  You feel sad and depressed.  Your baby is too sleepy to eat well.  Your baby is having trouble sleeping.   Your baby is wetting less than 3 diapers in a 24-hour period.  Your baby has less than 3 stools in a 24-hour period.  Your baby's skin or the white part of his or her eyes becomes yellow.   Your baby is not gaining weight by 5 days of age. SEEK IMMEDIATE MEDICAL CARE IF:   Your baby is overly tired (lethargic) and does not want to wake up and feed.  Your baby develops an unexplained fever. Document Released: 09/04/2005 Document Revised: 05/07/2013 Document Reviewed: 02/26/2013 [employer] Patient Information 2014 [employer].  

## 2014-03-04 LAB — CULTURE, BETA STREP (GROUP B ONLY)

## 2014-03-04 LAB — GC/CHLAMYDIA PROBE AMP
CT Probe RNA: NEGATIVE
GC PROBE AMP APTIMA: NEGATIVE

## 2014-03-05 ENCOUNTER — Encounter: Payer: Self-pay | Admitting: Family Medicine

## 2014-03-05 DIAGNOSIS — O99820 Streptococcus B carrier state complicating pregnancy: Secondary | ICD-10-CM | POA: Insufficient documentation

## 2014-03-09 ENCOUNTER — Encounter (HOSPITAL_COMMUNITY): Payer: Self-pay

## 2014-03-10 ENCOUNTER — Telehealth: Payer: Self-pay | Admitting: *Deleted

## 2014-03-10 NOTE — Telephone Encounter (Signed)
Patient is calling to check on her GBS status.  She is GBS positive and has had this before with previous pregnancies and understands she will be treated with antibiotics when she goes into labor.

## 2014-03-12 ENCOUNTER — Encounter: Payer: Self-pay | Admitting: Obstetrics & Gynecology

## 2014-03-12 ENCOUNTER — Ambulatory Visit (INDEPENDENT_AMBULATORY_CARE_PROVIDER_SITE_OTHER): Payer: Managed Care, Other (non HMO) | Admitting: Obstetrics & Gynecology

## 2014-03-12 VITALS — BP 111/76 | HR 74 | Wt 276.0 lb

## 2014-03-12 DIAGNOSIS — Z3480 Encounter for supervision of other normal pregnancy, unspecified trimester: Secondary | ICD-10-CM

## 2014-03-12 DIAGNOSIS — Z3483 Encounter for supervision of other normal pregnancy, third trimester: Secondary | ICD-10-CM

## 2014-03-12 NOTE — Patient Instructions (Signed)
Postpartum Tubal Ligation  A postpartum tubal ligation (PPTL) is a procedure that blocks the fallopian tubes right after childbirth or 1-2 days after childbirth. PPTL is done before the uterus returns to its normal location. The procedure is also called a minilaparotomy. By blocking the fallopian tubes, the eggs that are released from the ovaries cannot enter the uterus and sperm cannot reach the egg. A PPTL is done so you will not be able to get pregnant or have a baby.   Although this procedure may be reversed, it should be considered permanent and irreversible. If you want to have future pregnancies, you should not have this procedure.  LET YOUR CAREGIVER KNOW ABOUT:  · Allergies to food or medicine.  · Medicines taken, including vitamins, herbs, eyedrops, over-the-counter medicines, and creams.  · Use of steroids (by mouth or creams).  · Previous problems with numbing medicines.  · History of bleeding problems or blood clots.  · Any recent colds or infections.  · Previous surgery.  · Other health problems, including diabetes and kidney problems.  RISKS AND COMPLICATIONS  · Infection.  · Bleeding.  · Injury to other organs.  · Anesthetic side effects.  · Failure of the procedure.  · Ectopic pregnancy.  · Future regret about having the procedure done.  BEFORE THE PROCEDURE   · You may need to sign certain permission forms with your insurance up to 30 days before your due date.  · After delivering your baby, you cannot eat or drink anything if the procedure is performed the same day. If you are having the procedure a day after delivering, you may be able to eat and drink until midnight. Your caregiver will give you specific directions depending on your situation.  PROCEDURE   · If done 1-2 days after delivery:  ¨ You will be given a medicine to make you sleep (general anesthetic) during the procedure.  ¨ A tube will be put down your throat to help your breath while under general anesthesia.  ¨ A small cut  (incision) is made just beneath the belly button.  ¨ The fallopian tubes are brought up through the incision.  ¨ The fallopian tubes are then sealed, tied, or cut.  · If done after a caesarean delivery:  ¨ Tubal ligation is done through the incision already made (after the baby is delivered).  · Once the tubes are blocked, the incision is closed with stitches (sutures). A bandage will be placed over the incisions.  AFTER THE PROCEDURE  · You may have some pain or cramps in the abdominal area for the next 3-7 days.  · You will be given pain medicine to ease any discomfort.  · You may also feel sick to your stomach if you were given a general anesthetic.  · You may have some mild discomfort in the throat. This is from the tube that may have been placed in your throat while you were sleeping.  · You may feel tired and should rest the remainder of the day.  Document Released: 09/04/2005 Document Revised: 03/05/2012 Document Reviewed: 12/16/2011  [employer]® Patient Information ©2015 [employer]. This information is not intended to replace advice given to you by your health care [facility]. Make sure you discuss any questions you have with your health care Laranda Burkemper.

## 2014-03-12 NOTE — Progress Notes (Signed)
D/w pt + GBS.   She desires a PP BTL.  She does not want a BTL if she cannot have it PP as she does not want general anesthesia No problems today

## 2014-03-17 ENCOUNTER — Ambulatory Visit (INDEPENDENT_AMBULATORY_CARE_PROVIDER_SITE_OTHER): Payer: Managed Care, Other (non HMO) | Admitting: Family Medicine

## 2014-03-17 VITALS — BP 123/77 | HR 78 | Wt 282.0 lb

## 2014-03-17 DIAGNOSIS — O09899 Supervision of other high risk pregnancies, unspecified trimester: Secondary | ICD-10-CM

## 2014-03-17 DIAGNOSIS — Z22330 Carrier of Group B streptococcus: Secondary | ICD-10-CM

## 2014-03-17 DIAGNOSIS — O0940 Supervision of pregnancy with grand multiparity, unspecified trimester: Secondary | ICD-10-CM

## 2014-03-17 DIAGNOSIS — O0943 Supervision of pregnancy with grand multiparity, third trimester: Principal | ICD-10-CM

## 2014-03-17 DIAGNOSIS — O99820 Streptococcus B carrier state complicating pregnancy: Secondary | ICD-10-CM

## 2014-03-17 DIAGNOSIS — IMO0001 Reserved for inherently not codable concepts without codable children: Secondary | ICD-10-CM

## 2014-03-17 NOTE — Patient Instructions (Signed)
Breastfeeding Deciding to breastfeed is one of the best choices you can make for you and your baby. A change in hormones during pregnancy causes your breast tissue to grow and increases the number and size of your milk ducts. These hormones also allow proteins, sugars, and fats from your blood supply to make breast milk in your milk-producing glands. Hormones prevent breast milk from being released before your baby is born as well as prompt milk flow after birth. Once breastfeeding has begun, thoughts of your baby, as well as his or her sucking or crying, can stimulate the release of milk from your milk-producing glands.  BENEFITS OF BREASTFEEDING For Your Baby  Your first milk (colostrum) helps your baby's digestive system function better.   There are antibodies in your milk that help your baby fight off infections.   Your baby has a lower incidence of asthma, allergies, and sudden infant death syndrome.   The nutrients in breast milk are better for your baby than infant formulas and are designed uniquely for your baby's needs.   Breast milk improves your baby's brain development.   Your baby is less likely to develop other conditions, such as childhood obesity, asthma, or type 2 diabetes mellitus.  For You   Breastfeeding helps to create a very special bond between you and your baby.   Breastfeeding is convenient. Breast milk is always available at the correct temperature and costs nothing.   Breastfeeding helps to burn calories and helps you lose the weight gained during pregnancy.   Breastfeeding makes your uterus contract to its prepregnancy size faster and slows bleeding (lochia) after you give birth.   Breastfeeding helps to lower your risk of developing type 2 diabetes mellitus, osteoporosis, and breast or ovarian cancer later in life. SIGNS THAT YOUR BABY IS HUNGRY Early Signs of Hunger  Increased alertness or activity.  Stretching.  Movement of the head from  side to side.  Movement of the head and opening of the mouth when the corner of the mouth or cheek is stroked (rooting).  Increased sucking sounds, smacking lips, cooing, sighing, or squeaking.  Hand-to-mouth movements.  Increased sucking of fingers or hands. Late Signs of Hunger  Fussing.  Intermittent crying. Extreme Signs of Hunger Signs of extreme hunger will require calming and consoling before your baby will be able to breastfeed successfully. Do not wait for the following signs of extreme hunger to occur before you initiate breastfeeding:   Restlessness.  A loud, strong cry.   Screaming. BREASTFEEDING BASICS Breastfeeding Initiation  Find a comfortable place to sit or lie down, with your neck and back well supported.  Place a pillow or rolled up blanket under your baby to bring him or her to the level of your breast (if you are seated). Nursing pillows are specially designed to help support your arms and your baby while you breastfeed.  Make sure that your baby's abdomen is facing your abdomen.   Gently massage your breast. With your fingertips, massage from your chest wall toward your nipple in a circular motion. This encourages milk flow. You may need to continue this action during the feeding if your milk flows slowly.  Support your breast with 4 fingers underneath and your thumb above your nipple. Make sure your fingers are well away from your nipple and your baby's mouth.   Stroke your baby's lips gently with your finger or nipple.   When your baby's mouth is open wide enough, quickly bring your baby to your   breast, placing your entire nipple and as much of the colored area around your nipple (areola) as possible into your baby's mouth.   More areola should be visible above your baby's upper lip than below the lower lip.   Your baby's tongue should be between his or her lower gum and your breast.   Ensure that your baby's mouth is correctly positioned  around your nipple (latched). Your baby's lips should create a seal on your breast and be turned out (everted).  It is common for your baby to suck about 2-3 minutes in order to start the flow of breast milk. Latching Teaching your baby how to latch on to your breast properly is very important. An improper latch can cause nipple pain and decreased milk supply for you and poor weight gain in your baby. Also, if your baby is not latched onto your nipple properly, he or she may swallow some air during feeding. This can make your baby fussy. Burping your baby when you switch breasts during the feeding can help to get rid of the air. However, teaching your baby to latch on properly is still the best way to prevent fussiness from swallowing air while breastfeeding. Signs that your baby has successfully latched on to your nipple:    Silent tugging or silent sucking, without causing you pain.   Swallowing heard between every 3-4 sucks.    Muscle movement above and in front of his or her ears while sucking.  Signs that your baby has not successfully latched on to nipple:   Sucking sounds or smacking sounds from your baby while breastfeeding.  Nipple pain. If you think your baby has not latched on correctly, slip your finger into the corner of your baby's mouth to break the suction and place it between your baby's gums. Attempt breastfeeding initiation again. Signs of Successful Breastfeeding Signs from your baby:   A gradual decrease in the number of sucks or complete cessation of sucking.   Falling asleep.   Relaxation of his or her body.   Retention of a small amount of milk in his or her mouth.   Letting go of your breast by himself or herself. Signs from you:  Breasts that have increased in firmness, weight, and size 1-3 hours after feeding.   Breasts that are softer immediately after breastfeeding.  Increased milk volume, as well as a change in milk consistency and color by  the fifth day of breastfeeding.   Nipples that are not sore, cracked, or bleeding. Signs That Your Baby is Getting Enough Milk  Wetting at least 3 diapers in a 24-hour period. The urine should be clear and pale yellow by age 5 days.  At least 3 stools in a 24-hour period by age 5 days. The stool should be soft and yellow.  At least 3 stools in a 24-hour period by age 7 days. The stool should be seedy and yellow.  No loss of weight greater than 10% of birth weight during the first 3 days of age.  Average weight gain of 4-7 ounces (113-198 g) per week after age 4 days.  Consistent daily weight gain by age 5 days, without weight loss after the age of 2 weeks. After a feeding, your baby may spit up a small amount. This is common. BREASTFEEDING FREQUENCY AND DURATION Frequent feeding will help you make more milk and can prevent sore nipples and breast engorgement. Breastfeed when you feel the need to reduce the fullness of your breasts   or when your baby shows signs of hunger. This is called "breastfeeding on demand." Avoid introducing a pacifier to your baby while you are working to establish breastfeeding (the first 4-6 weeks after your baby is born). After this time you may choose to use a pacifier. Research has shown that pacifier use during the first year of a baby's life decreases the risk of sudden infant death syndrome (SIDS). Allow your baby to feed on each breast as long as he or she wants. Breastfeed until your baby is finished feeding. When your baby unlatches or falls asleep while feeding from the first breast, offer the second breast. Because newborns are often sleepy in the first few weeks of life, you may need to awaken your baby to get him or her to feed. Breastfeeding times will vary from baby to baby. However, the following rules can serve as a guide to help you ensure that your baby is properly fed:  Newborns (babies 4 weeks of age or younger) may breastfeed every 1-3  hours.  Newborns should not go longer than 3 hours during the day or 5 hours during the night without breastfeeding.  You should breastfeed your baby a minimum of 8 times in a 24-hour period until you begin to introduce solid foods to your baby at around 6 months of age. BREAST MILK PUMPING Pumping and storing breast milk allows you to ensure that your baby is exclusively fed your breast milk, even at times when you are unable to breastfeed. This is especially important if you are going back to work while you are still breastfeeding or when you are not able to be present during feedings. Your lactation consultant can give you guidelines on how long it is safe to store breast milk.  A breast pump is a machine that allows you to pump milk from your breast into a sterile bottle. The pumped breast milk can then be stored in a refrigerator or freezer. Some breast pumps are operated by hand, while others use electricity. Ask your lactation consultant which type will work best for you. Breast pumps can be purchased, but some hospitals and breastfeeding support groups lease breast pumps on a monthly basis. A lactation consultant can teach you how to hand express breast milk, if you prefer not to use a pump.  CARING FOR YOUR BREASTS WHILE YOU BREASTFEED Nipples can become dry, cracked, and sore while breastfeeding. The following recommendations can help keep your breasts moisturized and healthy:  Avoid using soap on your nipples.   Wear a supportive bra. Although not required, special nursing bras and tank tops are designed to allow access to your breasts for breastfeeding without taking off your entire bra or top. Avoid wearing underwire-style bras or extremely tight bras.  Air dry your nipples for 3-4minutes after each feeding.   Use only cotton bra pads to absorb leaked breast milk. Leaking of breast milk between feedings is normal.   Use lanolin on your nipples after breastfeeding. Lanolin helps to  maintain your skin's normal moisture barrier. If you use pure lanolin, you do not need to wash it off before feeding your baby again. Pure lanolin is not toxic to your baby. You may also hand express a few drops of breast milk and gently massage that milk into your nipples and allow the milk to air dry. In the first few weeks after giving birth, some women experience extremely full breasts (engorgement). Engorgement can make your breasts feel heavy, warm, and tender to the   touch. Engorgement peaks within 3-5 days after you give birth. The following recommendations can help ease engorgement:  Completely empty your breasts while breastfeeding or pumping. You may want to start by applying warm, moist heat (in the shower or with warm water-soaked hand towels) just before feeding or pumping. This increases circulation and helps the milk flow. If your baby does not completely empty your breasts while breastfeeding, pump any extra milk after he or she is finished.  Wear a snug bra (nursing or regular) or tank top for 1-2 days to signal your body to slightly decrease milk production.  Apply ice packs to your breasts, unless this is too uncomfortable for you.  Make sure that your baby is latched on and positioned properly while breastfeeding. If engorgement persists after 48 hours of following these recommendations, contact your health care provider or a lactation consultant. OVERALL HEALTH CARE RECOMMENDATIONS WHILE BREASTFEEDING  Eat healthy foods. Alternate between meals and snacks, eating 3 of each per day. Because what you eat affects your breast milk, some of the foods may make your baby more irritable than usual. Avoid eating these foods if you are sure that they are negatively affecting your baby.  Drink milk, fruit juice, and water to satisfy your thirst (about 10 glasses a day).   Rest often, relax, and continue to take your prenatal vitamins to prevent fatigue, stress, and anemia.  Continue  breast self-awareness checks.  Avoid chewing and smoking tobacco.  Avoid alcohol and drug use. Some medicines that may be harmful to your baby can pass through breast milk. It is important to ask your health care provider before taking any medicine, including all over-the-counter and prescription medicine as well as vitamin and herbal supplements. It is possible to become pregnant while breastfeeding. If birth control is desired, ask your health care provider about options that will be safe for your baby. SEEK MEDICAL CARE IF:   You feel like you want to stop breastfeeding or have become frustrated with breastfeeding.  You have painful breasts or nipples.  Your nipples are cracked or bleeding.  Your breasts are red, tender, or warm.  You have a swollen area on either breast.  You have a fever or chills.  You have nausea or vomiting.  You have drainage other than breast milk from your nipples.  Your breasts do not become full before feedings by the fifth day after you give birth.  You feel sad and depressed.  Your baby is too sleepy to eat well.  Your baby is having trouble sleeping.   Your baby is wetting less than 3 diapers in a 24-hour period.  Your baby has less than 3 stools in a 24-hour period.  Your baby's skin or the white part of his or her eyes becomes yellow.   Your baby is not gaining weight by 5 days of age. SEEK IMMEDIATE MEDICAL CARE IF:   Your baby is overly tired (lethargic) and does not want to wake up and feed.  Your baby develops an unexplained fever. Document Released: 09/04/2005 Document Revised: 09/09/2013 Document Reviewed: 02/26/2013 [employer] Patient Information 2015 [employer]. This information is not intended to replace advice given to you by your health care provider. Make sure you discuss any questions you have with your health care provider.  

## 2014-03-17 NOTE — Progress Notes (Signed)
Doing well Desires pp BTL Labor precautions

## 2014-03-24 ENCOUNTER — Ambulatory Visit (INDEPENDENT_AMBULATORY_CARE_PROVIDER_SITE_OTHER): Payer: Managed Care, Other (non HMO) | Admitting: Family Medicine

## 2014-03-24 VITALS — BP 132/84 | HR 80 | Wt 286.0 lb

## 2014-03-24 DIAGNOSIS — IMO0001 Reserved for inherently not codable concepts without codable children: Secondary | ICD-10-CM

## 2014-03-24 DIAGNOSIS — O0943 Supervision of pregnancy with grand multiparity, third trimester: Principal | ICD-10-CM

## 2014-03-24 DIAGNOSIS — O0940 Supervision of pregnancy with grand multiparity, unspecified trimester: Secondary | ICD-10-CM

## 2014-03-24 DIAGNOSIS — Z3480 Encounter for supervision of other normal pregnancy, unspecified trimester: Secondary | ICD-10-CM

## 2014-03-24 NOTE — Progress Notes (Signed)
Membranes stripped. Doing well Labor precautions

## 2014-03-24 NOTE — Patient Instructions (Signed)
Third Trimester of Pregnancy The third trimester is from week 29 through week 42, months 7 through 9. The third trimester is a time when the fetus is growing rapidly. At the end of the ninth month, the fetus is about 20 inches in length and weighs 6-10 pounds.  BODY CHANGES Your body goes through many changes during pregnancy. The changes vary from woman to woman.   Your weight will continue to increase. You can expect to gain 25-35 pounds (11-16 kg) by the end of the pregnancy.  You may begin to get stretch marks on your hips, abdomen, and breasts.  You may urinate more often because the fetus is moving lower into your pelvis and pressing on your bladder.  You may develop or continue to have heartburn as a result of your pregnancy.  You may develop constipation because certain hormones are causing the muscles that push waste through your intestines to slow down.  You may develop hemorrhoids or swollen, bulging veins (varicose veins).  You may have pelvic pain because of the weight gain and pregnancy hormones relaxing your joints between the bones in your pelvis. Backaches may result from overexertion of the muscles supporting your posture.  You may have changes in your hair. These can include thickening of your hair, rapid growth, and changes in texture. Some women also have hair loss during or after pregnancy, or hair that feels dry or thin. Your hair will most likely return to normal after your baby is born.  Your breasts will continue to grow and be tender. A yellow discharge may leak from your breasts called colostrum.  Your belly button may stick out.  You may feel short of breath because of your expanding uterus.  You may notice the fetus "dropping," or moving lower in your abdomen.  You may have a bloody mucus discharge. This usually occurs a few days to a week before labor begins.  Your cervix becomes thin and soft (effaced) near your due date. WHAT TO EXPECT AT YOUR  PRENATAL EXAMS  You will have prenatal exams every 2 weeks until week 36. Then, you will have weekly prenatal exams. During a routine prenatal visit:  You will be weighed to make sure you and the fetus are growing normally.  Your blood pressure is taken.  Your abdomen will be measured to track your baby's growth.  The fetal heartbeat will be listened to.  Any test results from the previous visit will be discussed.  You may have a cervical check near your due date to see if you have effaced. At around 36 weeks, your caregiver will check your cervix. At the same time, your caregiver will also perform a test on the secretions of the vaginal tissue. This test is to determine if a type of bacteria, Group B streptococcus, is present. Your caregiver will explain this further. Your caregiver may ask you:  What your birth plan is.  How you are feeling.  If you are feeling the baby move.  If you have had any abnormal symptoms, such as leaking fluid, bleeding, severe headaches, or abdominal cramping.  If you have any questions. Other tests or screenings that may be performed during your third trimester include:  Blood tests that check for low iron levels (anemia).  Fetal testing to check the health, activity level, and growth of the fetus. Testing is done if you have certain medical conditions or if there are problems during the pregnancy. FALSE LABOR You may feel small, irregular contractions that   eventually go away. These are called Braxton Hicks contractions, or false labor. Contractions may last for hours, days, or even weeks before true labor sets in. If contractions come at regular intervals, intensify, or become painful, it is best to be seen by your caregiver.  SIGNS OF LABOR   Menstrual-like cramps.  Contractions that are 5 minutes apart or less.  Contractions that start on the top of the uterus and spread down to the lower abdomen and back.  A sense of increased pelvic  pressure or back pain.  A watery or bloody mucus discharge that comes from the vagina. If you have any of these signs before the 37th week of pregnancy, call your caregiver right away. You need to go to the hospital to get checked immediately. HOME CARE INSTRUCTIONS   Avoid all smoking, herbs, alcohol, and unprescribed drugs. These chemicals affect the formation and growth of the baby.  Follow your caregiver's instructions regarding medicine use. There are medicines that are either safe or unsafe to take during pregnancy.  Exercise only as directed by your caregiver. Experiencing uterine cramps is a good sign to stop exercising.  Continue to eat regular, healthy meals.  Wear a good support bra for breast tenderness.  Do not use hot tubs, steam rooms, or saunas.  Wear your seat belt at all times when driving.  Avoid raw meat, uncooked cheese, cat litter boxes, and soil used by cats. These carry germs that can cause birth defects in the baby.  Take your prenatal vitamins.  Try taking a stool softener (if your caregiver approves) if you develop constipation. Eat more high-fiber foods, such as fresh vegetables or fruit and whole grains. Drink plenty of fluids to keep your urine clear or pale yellow.  Take warm sitz baths to soothe any pain or discomfort caused by hemorrhoids. Use hemorrhoid cream if your caregiver approves.  If you develop varicose veins, wear support hose. Elevate your feet for 15 minutes, 3-4 times a day. Limit salt in your diet.  Avoid heavy lifting, wear low heal shoes, and practice good posture.  Rest a lot with your legs elevated if you have leg cramps or low back pain.  Visit your dentist if you have not gone during your pregnancy. Use a soft toothbrush to brush your teeth and be gentle when you floss.  A sexual relationship may be continued unless your caregiver directs you otherwise.  Do not travel far distances unless it is absolutely necessary and only  with the approval of your caregiver.  Take prenatal classes to understand, practice, and ask questions about the labor and delivery.  Make a trial run to the hospital.  Pack your hospital bag.  Prepare the baby's nursery.  Continue to go to all your prenatal visits as directed by your caregiver. SEEK MEDICAL CARE IF:  You are unsure if you are in labor or if your water has broken.  You have dizziness.  You have mild pelvic cramps, pelvic pressure, or nagging pain in your abdominal area.  You have persistent nausea, vomiting, or diarrhea.  You have a bad smelling vaginal discharge.  You have pain with urination. SEEK IMMEDIATE MEDICAL CARE IF:   You have a fever.  You are leaking fluid from your vagina.  You have spotting or bleeding from your vagina.  You have severe abdominal cramping or pain.  You have rapid weight loss or gain.  You have shortness of breath with chest pain.  You notice sudden or extreme swelling   of your face, hands, ankles, feet, or legs.  You have not felt your baby move in over an hour.  You have severe headaches that do not go away with medicine.  You have vision changes. Document Released: 08/29/2001 Document Revised: 09/09/2013 Document Reviewed: 11/05/2012 [employer] Patient Information 2015 [employer]. This information is not intended to replace advice given to you by your health care provider. Make sure you discuss any questions you have with your health care provider.  Breastfeeding Deciding to breastfeed is one of the best choices you can make for you and your baby. A change in hormones during pregnancy causes your breast tissue to grow and increases the number and size of your milk ducts. These hormones also allow proteins, sugars, and fats from your blood supply to make breast milk in your milk-producing glands. Hormones prevent breast milk from being released before your baby is born as well as prompt milk flow after birth. Once  breastfeeding has begun, thoughts of your baby, as well as his or her sucking or crying, can stimulate the release of milk from your milk-producing glands.  BENEFITS OF BREASTFEEDING For Your Baby  Your first milk (colostrum) helps your baby's digestive system function better.   There are antibodies in your milk that help your baby fight off infections.   Your baby has a lower incidence of asthma, allergies, and sudden infant death syndrome.   The nutrients in breast milk are better for your baby than infant formulas and are designed uniquely for your baby's needs.   Breast milk improves your baby's brain development.   Your baby is less likely to develop other conditions, such as childhood obesity, asthma, or type 2 diabetes mellitus.  For You   Breastfeeding helps to create a very special bond between you and your baby.   Breastfeeding is convenient. Breast milk is always available at the correct temperature and costs nothing.   Breastfeeding helps to burn calories and helps you lose the weight gained during pregnancy.   Breastfeeding makes your uterus contract to its prepregnancy size faster and slows bleeding (lochia) after you give birth.   Breastfeeding helps to lower your risk of developing type 2 diabetes mellitus, osteoporosis, and breast or ovarian cancer later in life. SIGNS THAT YOUR BABY IS HUNGRY Early Signs of Hunger  Increased alertness or activity.  Stretching.  Movement of the head from side to side.  Movement of the head and opening of the mouth when the corner of the mouth or cheek is stroked (rooting).  Increased sucking sounds, smacking lips, cooing, sighing, or squeaking.  Hand-to-mouth movements.  Increased sucking of fingers or hands. Late Signs of Hunger  Fussing.  Intermittent crying. Extreme Signs of Hunger Signs of extreme hunger will require calming and consoling before your baby will be able to breastfeed successfully. Do not  wait for the following signs of extreme hunger to occur before you initiate breastfeeding:   Restlessness.  A loud, strong cry.   Screaming. BREASTFEEDING BASICS Breastfeeding Initiation  Find a comfortable place to sit or lie down, with your neck and back well supported.  Place a pillow or rolled up blanket under your baby to bring him or her to the level of your breast (if you are seated). Nursing pillows are specially designed to help support your arms and your baby while you breastfeed.  Make sure that your baby's abdomen is facing your abdomen.   Gently massage your breast. With your fingertips, massage from your chest   wall toward your nipple in a circular motion. This encourages milk flow. You may need to continue this action during the feeding if your milk flows slowly.  Support your breast with 4 fingers underneath and your thumb above your nipple. Make sure your fingers are well away from your nipple and your baby's mouth.   Stroke your baby's lips gently with your finger or nipple.   When your baby's mouth is open wide enough, quickly bring your baby to your breast, placing your entire nipple and as much of the colored area around your nipple (areola) as possible into your baby's mouth.   More areola should be visible above your baby's upper lip than below the lower lip.   Your baby's tongue should be between his or her lower gum and your breast.   Ensure that your baby's mouth is correctly positioned around your nipple (latched). Your baby's lips should create a seal on your breast and be turned out (everted).  It is common for your baby to suck about 2-3 minutes in order to start the flow of breast milk. Latching Teaching your baby how to latch on to your breast properly is very important. An improper latch can cause nipple pain and decreased milk supply for you and poor weight gain in your baby. Also, if your baby is not latched onto your nipple properly, he or she  may swallow some air during feeding. This can make your baby fussy. Burping your baby when you switch breasts during the feeding can help to get rid of the air. However, teaching your baby to latch on properly is still the best way to prevent fussiness from swallowing air while breastfeeding. Signs that your baby has successfully latched on to your nipple:    Silent tugging or silent sucking, without causing you pain.   Swallowing heard between every 3-4 sucks.    Muscle movement above and in front of his or her ears while sucking.  Signs that your baby has not successfully latched on to nipple:   Sucking sounds or smacking sounds from your baby while breastfeeding.  Nipple pain. If you think your baby has not latched on correctly, slip your finger into the corner of your baby's mouth to break the suction and place it between your baby's gums. Attempt breastfeeding initiation again. Signs of Successful Breastfeeding Signs from your baby:   A gradual decrease in the number of sucks or complete cessation of sucking.   Falling asleep.   Relaxation of his or her body.   Retention of a small amount of milk in his or her mouth.   Letting go of your breast by himself or herself. Signs from you:  Breasts that have increased in firmness, weight, and size 1-3 hours after feeding.   Breasts that are softer immediately after breastfeeding.  Increased milk volume, as well as a change in milk consistency and color by the fifth day of breastfeeding.   Nipples that are not sore, cracked, or bleeding. Signs That Your Baby is Getting Enough Milk  Wetting at least 3 diapers in a 24-hour period. The urine should be clear and pale yellow by age 5 days.  At least 3 stools in a 24-hour period by age 5 days. The stool should be soft and yellow.  At least 3 stools in a 24-hour period by age 7 days. The stool should be seedy and yellow.  No loss of weight greater than 10% of birth weight  during the first 3   days of age.  Average weight gain of 4-7 ounces (113-198 g) per week after age 4 days.  Consistent daily weight gain by age 5 days, without weight loss after the age of 2 weeks. After a feeding, your baby may spit up a small amount. This is common. BREASTFEEDING FREQUENCY AND DURATION Frequent feeding will help you make more milk and can prevent sore nipples and breast engorgement. Breastfeed when you feel the need to reduce the fullness of your breasts or when your baby shows signs of hunger. This is called "breastfeeding on demand." Avoid introducing a pacifier to your baby while you are working to establish breastfeeding (the first 4-6 weeks after your baby is born). After this time you may choose to use a pacifier. Research has shown that pacifier use during the first year of a baby's life decreases the risk of sudden infant death syndrome (SIDS). Allow your baby to feed on each breast as long as he or she wants. Breastfeed until your baby is finished feeding. When your baby unlatches or falls asleep while feeding from the first breast, offer the second breast. Because newborns are often sleepy in the first few weeks of life, you may need to awaken your baby to get him or her to feed. Breastfeeding times will vary from baby to baby. However, the following rules can serve as a guide to help you ensure that your baby is properly fed:  Newborns (babies 4 weeks of age or younger) may breastfeed every 1-3 hours.  Newborns should not go longer than 3 hours during the day or 5 hours during the night without breastfeeding.  You should breastfeed your baby a minimum of 8 times in a 24-hour period until you begin to introduce solid foods to your baby at around 6 months of age. BREAST MILK PUMPING Pumping and storing breast milk allows you to ensure that your baby is exclusively fed your breast milk, even at times when you are unable to breastfeed. This is especially important if you are  going back to work while you are still breastfeeding or when you are not able to be present during feedings. Your lactation consultant can give you guidelines on how long it is safe to store breast milk.  A breast pump is a machine that allows you to pump milk from your breast into a sterile bottle. The pumped breast milk can then be stored in a refrigerator or freezer. Some breast pumps are operated by hand, while others use electricity. Ask your lactation consultant which type will work best for you. Breast pumps can be purchased, but some hospitals and breastfeeding support groups lease breast pumps on a monthly basis. A lactation consultant can teach you how to hand express breast milk, if you prefer not to use a pump.  CARING FOR YOUR BREASTS WHILE YOU BREASTFEED Nipples can become dry, cracked, and sore while breastfeeding. The following recommendations can help keep your breasts moisturized and healthy:  Avoid using soap on your nipples.   Wear a supportive bra. Although not required, special nursing bras and tank tops are designed to allow access to your breasts for breastfeeding without taking off your entire bra or top. Avoid wearing underwire-style bras or extremely tight bras.  Air dry your nipples for 3-4minutes after each feeding.   Use only cotton bra pads to absorb leaked breast milk. Leaking of breast milk between feedings is normal.   Use lanolin on your nipples after breastfeeding. Lanolin helps to maintain your skin's   normal moisture barrier. If you use pure lanolin, you do not need to wash it off before feeding your baby again. Pure lanolin is not toxic to your baby. You may also hand express a few drops of breast milk and gently massage that milk into your nipples and allow the milk to air dry. In the first few weeks after giving birth, some women experience extremely full breasts (engorgement). Engorgement can make your breasts feel heavy, warm, and tender to the touch.  Engorgement peaks within 3-5 days after you give birth. The following recommendations can help ease engorgement:  Completely empty your breasts while breastfeeding or pumping. You may want to start by applying warm, moist heat (in the shower or with warm water-soaked hand towels) just before feeding or pumping. This increases circulation and helps the milk flow. If your baby does not completely empty your breasts while breastfeeding, pump any extra milk after he or she is finished.  Wear a snug bra (nursing or regular) or tank top for 1-2 days to signal your body to slightly decrease milk production.  Apply ice packs to your breasts, unless this is too uncomfortable for you.  Make sure that your baby is latched on and positioned properly while breastfeeding. If engorgement persists after 48 hours of following these recommendations, contact your health care provider or a lactation consultant. OVERALL HEALTH CARE RECOMMENDATIONS WHILE BREASTFEEDING  Eat healthy foods. Alternate between meals and snacks, eating 3 of each per day. Because what you eat affects your breast milk, some of the foods may make your baby more irritable than usual. Avoid eating these foods if you are sure that they are negatively affecting your baby.  Drink milk, fruit juice, and water to satisfy your thirst (about 10 glasses a day).   Rest often, relax, and continue to take your prenatal vitamins to prevent fatigue, stress, and anemia.  Continue breast self-awareness checks.  Avoid chewing and smoking tobacco.  Avoid alcohol and drug use. Some medicines that may be harmful to your baby can pass through breast milk. It is important to ask your health care provider before taking any medicine, including all over-the-counter and prescription medicine as well as vitamin and herbal supplements. It is possible to become pregnant while breastfeeding. If birth control is desired, ask your health care provider about options that  will be safe for your baby. SEEK MEDICAL CARE IF:   You feel like you want to stop breastfeeding or have become frustrated with breastfeeding.  You have painful breasts or nipples.  Your nipples are cracked or bleeding.  Your breasts are red, tender, or warm.  You have a swollen area on either breast.  You have a fever or chills.  You have nausea or vomiting.  You have drainage other than breast milk from your nipples.  Your breasts do not become full before feedings by the fifth day after you give birth.  You feel sad and depressed.  Your baby is too sleepy to eat well.  Your baby is having trouble sleeping.   Your baby is wetting less than 3 diapers in a 24-hour period.  Your baby has less than 3 stools in a 24-hour period.  Your baby's skin or the white part of his or her eyes becomes yellow.   Your baby is not gaining weight by 5 days of age. SEEK IMMEDIATE MEDICAL CARE IF:   Your baby is overly tired (lethargic) and does not want to wake up and feed.  Your baby   develops an unexplained fever. Document Released: 09/04/2005 Document Revised: 09/09/2013 Document Reviewed: 02/26/2013 [employer] Patient Information 2015 [employer]. This information is not intended to replace advice given to you by your health care provider. Make sure you discuss any questions you have with your health care provider.  

## 2014-03-30 ENCOUNTER — Encounter: Payer: Self-pay | Admitting: *Deleted

## 2014-04-01 ENCOUNTER — Ambulatory Visit (INDEPENDENT_AMBULATORY_CARE_PROVIDER_SITE_OTHER): Payer: Managed Care, Other (non HMO) | Admitting: Family Medicine

## 2014-04-01 VITALS — BP 113/77 | HR 82 | Wt 287.0 lb

## 2014-04-01 DIAGNOSIS — Z3480 Encounter for supervision of other normal pregnancy, unspecified trimester: Secondary | ICD-10-CM

## 2014-04-01 DIAGNOSIS — IMO0001 Reserved for inherently not codable concepts without codable children: Secondary | ICD-10-CM

## 2014-04-01 DIAGNOSIS — O0943 Supervision of pregnancy with grand multiparity, third trimester: Principal | ICD-10-CM

## 2014-04-01 DIAGNOSIS — O0940 Supervision of pregnancy with grand multiparity, unspecified trimester: Secondary | ICD-10-CM

## 2014-04-01 NOTE — Progress Notes (Signed)
Doing well Labor precautions Membranes stripped

## 2014-04-01 NOTE — Patient Instructions (Signed)
Third Trimester of Pregnancy The third trimester is from week 29 through week 42, months 7 through 9. The third trimester is a time when the fetus is growing rapidly. At the end of the ninth month, the fetus is about 20 inches in length and weighs 6-10 pounds.  BODY CHANGES Your body goes through many changes during pregnancy. The changes vary from woman to woman.   Your weight will continue to increase. You can expect to gain 25-35 pounds (11-16 kg) by the end of the pregnancy.  You may begin to get stretch marks on your hips, abdomen, and breasts.  You may urinate more often because the fetus is moving lower into your pelvis and pressing on your bladder.  You may develop or continue to have heartburn as a result of your pregnancy.  You may develop constipation because certain hormones are causing the muscles that push waste through your intestines to slow down.  You may develop hemorrhoids or swollen, bulging veins (varicose veins).  You may have pelvic pain because of the weight gain and pregnancy hormones relaxing your joints between the bones in your pelvis. Backaches may result from overexertion of the muscles supporting your posture.  You may have changes in your hair. These can include thickening of your hair, rapid growth, and changes in texture. Some women also have hair loss during or after pregnancy, or hair that feels dry or thin. Your hair will most likely return to normal after your baby is born.  Your breasts will continue to grow and be tender. A yellow discharge may leak from your breasts called colostrum.  Your belly button may stick out.  You may feel short of breath because of your expanding uterus.  You may notice the fetus "dropping," or moving lower in your abdomen.  You may have a bloody mucus discharge. This usually occurs a few days to a week before labor begins.  Your cervix becomes thin and soft (effaced) near your due date. WHAT TO EXPECT AT YOUR  PRENATAL EXAMS  You will have prenatal exams every 2 weeks until week 36. Then, you will have weekly prenatal exams. During a routine prenatal visit:  You will be weighed to make sure you and the fetus are growing normally.  Your blood pressure is taken.  Your abdomen will be measured to track your baby's growth.  The fetal heartbeat will be listened to.  Any test results from the previous visit will be discussed.  You may have a cervical check near your due date to see if you have effaced. At around 36 weeks, your caregiver will check your cervix. At the same time, your caregiver will also perform a test on the secretions of the vaginal tissue. This test is to determine if a type of bacteria, Group B streptococcus, is present. Your caregiver will explain this further. Your caregiver may ask you:  What your birth plan is.  How you are feeling.  If you are feeling the baby move.  If you have had any abnormal symptoms, such as leaking fluid, bleeding, severe headaches, or abdominal cramping.  If you have any questions. Other tests or screenings that may be performed during your third trimester include:  Blood tests that check for low iron levels (anemia).  Fetal testing to check the health, activity level, and growth of the fetus. Testing is done if you have certain medical conditions or if there are problems during the pregnancy. FALSE LABOR You may feel small, irregular contractions that   eventually go away. These are called Braxton Hicks contractions, or false labor. Contractions may last for hours, days, or even weeks before true labor sets in. If contractions come at regular intervals, intensify, or become painful, it is best to be seen by your caregiver.  SIGNS OF LABOR   Menstrual-like cramps.  Contractions that are 5 minutes apart or less.  Contractions that start on the top of the uterus and spread down to the lower abdomen and back.  A sense of increased pelvic  pressure or back pain.  A watery or bloody mucus discharge that comes from the vagina. If you have any of these signs before the 37th week of pregnancy, call your caregiver right away. You need to go to the hospital to get checked immediately. HOME CARE INSTRUCTIONS   Avoid all smoking, herbs, alcohol, and unprescribed drugs. These chemicals affect the formation and growth of the baby.  Follow your caregiver's instructions regarding medicine use. There are medicines that are either safe or unsafe to take during pregnancy.  Exercise only as directed by your caregiver. Experiencing uterine cramps is a good sign to stop exercising.  Continue to eat regular, healthy meals.  Wear a good support bra for breast tenderness.  Do not use hot tubs, steam rooms, or saunas.  Wear your seat belt at all times when driving.  Avoid raw meat, uncooked cheese, cat litter boxes, and soil used by cats. These carry germs that can cause birth defects in the baby.  Take your prenatal vitamins.  Try taking a stool softener (if your caregiver approves) if you develop constipation. Eat more high-fiber foods, such as fresh vegetables or fruit and whole grains. Drink plenty of fluids to keep your urine clear or pale yellow.  Take warm sitz baths to soothe any pain or discomfort caused by hemorrhoids. Use hemorrhoid cream if your caregiver approves.  If you develop varicose veins, wear support hose. Elevate your feet for 15 minutes, 3-4 times a day. Limit salt in your diet.  Avoid heavy lifting, wear low heal shoes, and practice good posture.  Rest a lot with your legs elevated if you have leg cramps or low back pain.  Visit your dentist if you have not gone during your pregnancy. Use a soft toothbrush to brush your teeth and be gentle when you floss.  A sexual relationship may be continued unless your caregiver directs you otherwise.  Do not travel far distances unless it is absolutely necessary and only  with the approval of your caregiver.  Take prenatal classes to understand, practice, and ask questions about the labor and delivery.  Make a trial run to the hospital.  Pack your hospital bag.  Prepare the baby's nursery.  Continue to go to all your prenatal visits as directed by your caregiver. SEEK MEDICAL CARE IF:  You are unsure if you are in labor or if your water has broken.  You have dizziness.  You have mild pelvic cramps, pelvic pressure, or nagging pain in your abdominal area.  You have persistent nausea, vomiting, or diarrhea.  You have a bad smelling vaginal discharge.  You have pain with urination. SEEK IMMEDIATE MEDICAL CARE IF:   You have a fever.  You are leaking fluid from your vagina.  You have spotting or bleeding from your vagina.  You have severe abdominal cramping or pain.  You have rapid weight loss or gain.  You have shortness of breath with chest pain.  You notice sudden or extreme swelling   of your face, hands, ankles, feet, or legs.  You have not felt your baby move in over an hour.  You have severe headaches that do not go away with medicine.  You have vision changes. Document Released: 08/29/2001 Document Revised: 09/09/2013 Document Reviewed: 11/05/2012 [employer] Patient Information 2015 [employer]. This information is not intended to replace advice given to you by your health care provider. Make sure you discuss any questions you have with your health care provider.  Breastfeeding Deciding to breastfeed is one of the best choices you can make for you and your baby. A change in hormones during pregnancy causes your breast tissue to grow and increases the number and size of your milk ducts. These hormones also allow proteins, sugars, and fats from your blood supply to make breast milk in your milk-producing glands. Hormones prevent breast milk from being released before your baby is born as well as prompt milk flow after birth. Once  breastfeeding has begun, thoughts of your baby, as well as his or her sucking or crying, can stimulate the release of milk from your milk-producing glands.  BENEFITS OF BREASTFEEDING For Your Baby  Your first milk (colostrum) helps your baby's digestive system function better.   There are antibodies in your milk that help your baby fight off infections.   Your baby has a lower incidence of asthma, allergies, and sudden infant death syndrome.   The nutrients in breast milk are better for your baby than infant formulas and are designed uniquely for your baby's needs.   Breast milk improves your baby's brain development.   Your baby is less likely to develop other conditions, such as childhood obesity, asthma, or type 2 diabetes mellitus.  For You   Breastfeeding helps to create a very special bond between you and your baby.   Breastfeeding is convenient. Breast milk is always available at the correct temperature and costs nothing.   Breastfeeding helps to burn calories and helps you lose the weight gained during pregnancy.   Breastfeeding makes your uterus contract to its prepregnancy size faster and slows bleeding (lochia) after you give birth.   Breastfeeding helps to lower your risk of developing type 2 diabetes mellitus, osteoporosis, and breast or ovarian cancer later in life. SIGNS THAT YOUR BABY IS HUNGRY Early Signs of Hunger  Increased alertness or activity.  Stretching.  Movement of the head from side to side.  Movement of the head and opening of the mouth when the corner of the mouth or cheek is stroked (rooting).  Increased sucking sounds, smacking lips, cooing, sighing, or squeaking.  Hand-to-mouth movements.  Increased sucking of fingers or hands. Late Signs of Hunger  Fussing.  Intermittent crying. Extreme Signs of Hunger Signs of extreme hunger will require calming and consoling before your baby will be able to breastfeed successfully. Do not  wait for the following signs of extreme hunger to occur before you initiate breastfeeding:   Restlessness.  A loud, strong cry.   Screaming. BREASTFEEDING BASICS Breastfeeding Initiation  Find a comfortable place to sit or lie down, with your neck and back well supported.  Place a pillow or rolled up blanket under your baby to bring him or her to the level of your breast (if you are seated). Nursing pillows are specially designed to help support your arms and your baby while you breastfeed.  Make sure that your baby's abdomen is facing your abdomen.   Gently massage your breast. With your fingertips, massage from your chest   wall toward your nipple in a circular motion. This encourages milk flow. You may need to continue this action during the feeding if your milk flows slowly.  Support your breast with 4 fingers underneath and your thumb above your nipple. Make sure your fingers are well away from your nipple and your baby's mouth.   Stroke your baby's lips gently with your finger or nipple.   When your baby's mouth is open wide enough, quickly bring your baby to your breast, placing your entire nipple and as much of the colored area around your nipple (areola) as possible into your baby's mouth.   More areola should be visible above your baby's upper lip than below the lower lip.   Your baby's tongue should be between his or her lower gum and your breast.   Ensure that your baby's mouth is correctly positioned around your nipple (latched). Your baby's lips should create a seal on your breast and be turned out (everted).  It is common for your baby to suck about 2-3 minutes in order to start the flow of breast milk. Latching Teaching your baby how to latch on to your breast properly is very important. An improper latch can cause nipple pain and decreased milk supply for you and poor weight gain in your baby. Also, if your baby is not latched onto your nipple properly, he or she  may swallow some air during feeding. This can make your baby fussy. Burping your baby when you switch breasts during the feeding can help to get rid of the air. However, teaching your baby to latch on properly is still the best way to prevent fussiness from swallowing air while breastfeeding. Signs that your baby has successfully latched on to your nipple:    Silent tugging or silent sucking, without causing you pain.   Swallowing heard between every 3-4 sucks.    Muscle movement above and in front of his or her ears while sucking.  Signs that your baby has not successfully latched on to nipple:   Sucking sounds or smacking sounds from your baby while breastfeeding.  Nipple pain. If you think your baby has not latched on correctly, slip your finger into the corner of your baby's mouth to break the suction and place it between your baby's gums. Attempt breastfeeding initiation again. Signs of Successful Breastfeeding Signs from your baby:   A gradual decrease in the number of sucks or complete cessation of sucking.   Falling asleep.   Relaxation of his or her body.   Retention of a small amount of milk in his or her mouth.   Letting go of your breast by himself or herself. Signs from you:  Breasts that have increased in firmness, weight, and size 1-3 hours after feeding.   Breasts that are softer immediately after breastfeeding.  Increased milk volume, as well as a change in milk consistency and color by the fifth day of breastfeeding.   Nipples that are not sore, cracked, or bleeding. Signs That Your Baby is Getting Enough Milk  Wetting at least 3 diapers in a 24-hour period. The urine should be clear and pale yellow by age 5 days.  At least 3 stools in a 24-hour period by age 5 days. The stool should be soft and yellow.  At least 3 stools in a 24-hour period by age 7 days. The stool should be seedy and yellow.  No loss of weight greater than 10% of birth weight  during the first 3   days of age.  Average weight gain of 4-7 ounces (113-198 g) per week after age 4 days.  Consistent daily weight gain by age 5 days, without weight loss after the age of 2 weeks. After a feeding, your baby may spit up a small amount. This is common. BREASTFEEDING FREQUENCY AND DURATION Frequent feeding will help you make more milk and can prevent sore nipples and breast engorgement. Breastfeed when you feel the need to reduce the fullness of your breasts or when your baby shows signs of hunger. This is called "breastfeeding on demand." Avoid introducing a pacifier to your baby while you are working to establish breastfeeding (the first 4-6 weeks after your baby is born). After this time you may choose to use a pacifier. Research has shown that pacifier use during the first year of a baby's life decreases the risk of sudden infant death syndrome (SIDS). Allow your baby to feed on each breast as long as he or she wants. Breastfeed until your baby is finished feeding. When your baby unlatches or falls asleep while feeding from the first breast, offer the second breast. Because newborns are often sleepy in the first few weeks of life, you may need to awaken your baby to get him or her to feed. Breastfeeding times will vary from baby to baby. However, the following rules can serve as a guide to help you ensure that your baby is properly fed:  Newborns (babies 4 weeks of age or younger) may breastfeed every 1-3 hours.  Newborns should not go longer than 3 hours during the day or 5 hours during the night without breastfeeding.  You should breastfeed your baby a minimum of 8 times in a 24-hour period until you begin to introduce solid foods to your baby at around 6 months of age. BREAST MILK PUMPING Pumping and storing breast milk allows you to ensure that your baby is exclusively fed your breast milk, even at times when you are unable to breastfeed. This is especially important if you are  going back to work while you are still breastfeeding or when you are not able to be present during feedings. Your lactation consultant can give you guidelines on how long it is safe to store breast milk.  A breast pump is a machine that allows you to pump milk from your breast into a sterile bottle. The pumped breast milk can then be stored in a refrigerator or freezer. Some breast pumps are operated by hand, while others use electricity. Ask your lactation consultant which type will work best for you. Breast pumps can be purchased, but some hospitals and breastfeeding support groups lease breast pumps on a monthly basis. A lactation consultant can teach you how to hand express breast milk, if you prefer not to use a pump.  CARING FOR YOUR BREASTS WHILE YOU BREASTFEED Nipples can become dry, cracked, and sore while breastfeeding. The following recommendations can help keep your breasts moisturized and healthy:  Avoid using soap on your nipples.   Wear a supportive bra. Although not required, special nursing bras and tank tops are designed to allow access to your breasts for breastfeeding without taking off your entire bra or top. Avoid wearing underwire-style bras or extremely tight bras.  Air dry your nipples for 3-4minutes after each feeding.   Use only cotton bra pads to absorb leaked breast milk. Leaking of breast milk between feedings is normal.   Use lanolin on your nipples after breastfeeding. Lanolin helps to maintain your skin's   normal moisture barrier. If you use pure lanolin, you do not need to wash it off before feeding your baby again. Pure lanolin is not toxic to your baby. You may also hand express a few drops of breast milk and gently massage that milk into your nipples and allow the milk to air dry. In the first few weeks after giving birth, some women experience extremely full breasts (engorgement). Engorgement can make your breasts feel heavy, warm, and tender to the touch.  Engorgement peaks within 3-5 days after you give birth. The following recommendations can help ease engorgement:  Completely empty your breasts while breastfeeding or pumping. You may want to start by applying warm, moist heat (in the shower or with warm water-soaked hand towels) just before feeding or pumping. This increases circulation and helps the milk flow. If your baby does not completely empty your breasts while breastfeeding, pump any extra milk after he or she is finished.  Wear a snug bra (nursing or regular) or tank top for 1-2 days to signal your body to slightly decrease milk production.  Apply ice packs to your breasts, unless this is too uncomfortable for you.  Make sure that your baby is latched on and positioned properly while breastfeeding. If engorgement persists after 48 hours of following these recommendations, contact your health care provider or a lactation consultant. OVERALL HEALTH CARE RECOMMENDATIONS WHILE BREASTFEEDING  Eat healthy foods. Alternate between meals and snacks, eating 3 of each per day. Because what you eat affects your breast milk, some of the foods may make your baby more irritable than usual. Avoid eating these foods if you are sure that they are negatively affecting your baby.  Drink milk, fruit juice, and water to satisfy your thirst (about 10 glasses a day).   Rest often, relax, and continue to take your prenatal vitamins to prevent fatigue, stress, and anemia.  Continue breast self-awareness checks.  Avoid chewing and smoking tobacco.  Avoid alcohol and drug use. Some medicines that may be harmful to your baby can pass through breast milk. It is important to ask your health care provider before taking any medicine, including all over-the-counter and prescription medicine as well as vitamin and herbal supplements. It is possible to become pregnant while breastfeeding. If birth control is desired, ask your health care provider about options that  will be safe for your baby. SEEK MEDICAL CARE IF:   You feel like you want to stop breastfeeding or have become frustrated with breastfeeding.  You have painful breasts or nipples.  Your nipples are cracked or bleeding.  Your breasts are red, tender, or warm.  You have a swollen area on either breast.  You have a fever or chills.  You have nausea or vomiting.  You have drainage other than breast milk from your nipples.  Your breasts do not become full before feedings by the fifth day after you give birth.  You feel sad and depressed.  Your baby is too sleepy to eat well.  Your baby is having trouble sleeping.   Your baby is wetting less than 3 diapers in a 24-hour period.  Your baby has less than 3 stools in a 24-hour period.  Your baby's skin or the white part of his or her eyes becomes yellow.   Your baby is not gaining weight by 5 days of age. SEEK IMMEDIATE MEDICAL CARE IF:   Your baby is overly tired (lethargic) and does not want to wake up and feed.  Your baby   develops an unexplained fever. Document Released: 09/04/2005 Document Revised: 09/09/2013 Document Reviewed: 02/26/2013 [employer] Patient Information 2015 [employer]. This information is not intended to replace advice given to you by your health care provider. Make sure you discuss any questions you have with your health care provider.  

## 2014-04-04 ENCOUNTER — Observation Stay (HOSPITAL_COMMUNITY): Payer: Managed Care, Other (non HMO) | Admitting: Anesthesiology

## 2014-04-04 ENCOUNTER — Encounter (HOSPITAL_COMMUNITY): Payer: Self-pay | Admitting: *Deleted

## 2014-04-04 ENCOUNTER — Inpatient Hospital Stay (HOSPITAL_COMMUNITY)
Admission: AD | Admit: 2014-04-04 | Discharge: 2014-04-06 | DRG: 775 | Disposition: A | Discharge status: Home / Self Care | Payer: Managed Care, Other (non HMO) | Source: Ambulatory Visit | Attending: Obstetrics & Gynecology | Admitting: Obstetrics & Gynecology

## 2014-04-04 ENCOUNTER — Inpatient Hospital Stay (HOSPITAL_COMMUNITY)
Admission: AD | Admit: 2014-04-04 | Discharge: 2014-04-04 | Disposition: A | Discharge status: Home / Self Care | Payer: Managed Care, Other (non HMO) | Source: Ambulatory Visit | Attending: Obstetrics & Gynecology | Admitting: Obstetrics & Gynecology

## 2014-04-04 ENCOUNTER — Inpatient Hospital Stay (HOSPITAL_COMMUNITY): Payer: Managed Care, Other (non HMO)

## 2014-04-04 ENCOUNTER — Encounter (HOSPITAL_COMMUNITY): Payer: Managed Care, Other (non HMO) | Admitting: Anesthesiology

## 2014-04-04 DIAGNOSIS — IMO0001 Reserved for inherently not codable concepts without codable children: Secondary | ICD-10-CM

## 2014-04-04 DIAGNOSIS — O99892 Other specified diseases and conditions complicating childbirth: Principal | ICD-10-CM | POA: Diagnosis present

## 2014-04-04 DIAGNOSIS — O4290 Premature rupture of membranes, unspecified as to length of time between rupture and onset of labor, unspecified weeks of gestation: Secondary | ICD-10-CM

## 2014-04-04 DIAGNOSIS — O9989 Other specified diseases and conditions complicating pregnancy, childbirth and the puerperium: Principal | ICD-10-CM

## 2014-04-04 DIAGNOSIS — O99210 Obesity complicating pregnancy, unspecified trimester: Secondary | ICD-10-CM

## 2014-04-04 DIAGNOSIS — O479 False labor, unspecified: Secondary | ICD-10-CM | POA: Insufficient documentation

## 2014-04-04 DIAGNOSIS — E669 Obesity, unspecified: Secondary | ICD-10-CM | POA: Insufficient documentation

## 2014-04-04 DIAGNOSIS — Z22330 Carrier of Group B streptococcus: Secondary | ICD-10-CM

## 2014-04-04 DIAGNOSIS — O99214 Obesity complicating childbirth: Secondary | ICD-10-CM

## 2014-04-04 DIAGNOSIS — Z833 Family history of diabetes mellitus: Secondary | ICD-10-CM

## 2014-04-04 DIAGNOSIS — O0943 Supervision of pregnancy with grand multiparity, third trimester: Secondary | ICD-10-CM

## 2014-04-04 DIAGNOSIS — O99820 Streptococcus B carrier state complicating pregnancy: Secondary | ICD-10-CM

## 2014-04-04 DIAGNOSIS — Z6841 Body Mass Index (BMI) 40.0 and over, adult: Secondary | ICD-10-CM

## 2014-04-04 DIAGNOSIS — Z8249 Family history of ischemic heart disease and other diseases of the circulatory system: Secondary | ICD-10-CM

## 2014-04-04 DIAGNOSIS — O99891 Other specified diseases and conditions complicating pregnancy: Secondary | ICD-10-CM | POA: Insufficient documentation

## 2014-04-04 HISTORY — DX: Other specified health status: Z78.9

## 2014-04-04 LAB — CBC
HCT: 32.9 % — ABNORMAL LOW (ref 36.0–46.0)
Hemoglobin: 10.8 g/dL — ABNORMAL LOW (ref 12.0–15.0)
MCH: 28.6 pg (ref 26.0–34.0)
MCHC: 32.8 g/dL (ref 30.0–36.0)
MCV: 87 fL (ref 78.0–100.0)
Platelets: 204 10*3/uL (ref 150–400)
RBC: 3.78 MIL/uL — AB (ref 3.87–5.11)
RDW: 14.2 % (ref 11.5–15.5)
WBC: 8.9 10*3/uL (ref 4.0–10.5)

## 2014-04-04 LAB — OB RESULTS CONSOLE GBS: GBS: POSITIVE

## 2014-04-04 LAB — RPR

## 2014-04-04 LAB — TYPE AND SCREEN
ABO/RH(D): B POS
Antibody Screen: NEGATIVE

## 2014-04-04 MED ORDER — DIPHENHYDRAMINE HCL 50 MG/ML IJ SOLN: 12.5000 mg | INTRAMUSCULAR | Status: DC | PRN

## 2014-04-04 MED ORDER — PHENYLEPHRINE 40 MCG/ML (10ML) SYRINGE FOR IV PUSH (FOR BLOOD PRESSURE SUPPORT)
80.0000 ug | PREFILLED_SYRINGE | INTRAVENOUS | Status: DC | PRN
  Filled 2014-04-04: qty 2

## 2014-04-04 MED ORDER — FENTANYL 2.5 MCG/ML BUPIVACAINE 1/10 % EPIDURAL INFUSION (WH - ANES): 14.0000 mL/h | INTRAMUSCULAR | Status: DC | PRN

## 2014-04-04 MED ORDER — OXYTOCIN 40 UNITS IN LACTATED RINGERS INFUSION - SIMPLE MED: 62.5000 mL/h | INTRAVENOUS | Status: DC

## 2014-04-04 MED ORDER — FLEET ENEMA 7-19 GM/118ML RE ENEM: 1.0000 | ENEMA | RECTAL | Status: DC | PRN

## 2014-04-04 MED ORDER — LIDOCAINE HCL (PF) 1 % IJ SOLN
INTRAMUSCULAR | Status: DC | PRN
  Administered 2014-04-04: 5 mL
  Administered 2014-04-04: 3 mL
  Administered 2014-04-04: 5 mL

## 2014-04-04 MED ORDER — OXYCODONE-ACETAMINOPHEN 5-325 MG PO TABS: 1.0000 | ORAL_TABLET | ORAL | Status: DC | PRN

## 2014-04-04 MED ORDER — LACTATED RINGERS IV SOLN: 500.0000 mL | INTRAVENOUS | Status: DC | PRN

## 2014-04-04 MED ORDER — FENTANYL 2.5 MCG/ML BUPIVACAINE 1/10 % EPIDURAL INFUSION (WH - ANES)
INTRAMUSCULAR | Status: DC | PRN
  Administered 2014-04-04: 14 mL/h via EPIDURAL

## 2014-04-04 MED ORDER — PENICILLIN G POTASSIUM 5000000 UNITS IJ SOLR
2.5000 10*6.[IU] | INTRAVENOUS | Status: DC
  Administered 2014-04-04: 2.5 10*6.[IU] via INTRAVENOUS
  Filled 2014-04-04 (×6): qty 2.5

## 2014-04-04 MED ORDER — CITRIC ACID-SODIUM CITRATE 334-500 MG/5ML PO SOLN: 30.0000 mL | ORAL | Status: DC | PRN

## 2014-04-04 MED ORDER — OXYTOCIN BOLUS FROM INFUSION: 500.0000 mL | INTRAVENOUS | Status: DC

## 2014-04-04 MED ORDER — ONDANSETRON HCL 4 MG/2ML IJ SOLN: 4.0000 mg | Freq: Four times a day (QID) | INTRAMUSCULAR | Status: DC | PRN

## 2014-04-04 MED ORDER — PENICILLIN G POTASSIUM 5000000 UNITS IJ SOLR
5.0000 10*6.[IU] | Freq: Once | INTRAVENOUS | Status: AC
  Administered 2014-04-04: 5 10*6.[IU] via INTRAVENOUS
  Filled 2014-04-04: qty 5

## 2014-04-04 MED ORDER — LIDOCAINE HCL (PF) 1 % IJ SOLN
30.0000 mL | INTRAMUSCULAR | Status: DC | PRN
  Filled 2014-04-04: qty 30

## 2014-04-04 MED ORDER — TERBUTALINE SULFATE 1 MG/ML IJ SOLN: 0.2500 mg | Freq: Once | INTRAMUSCULAR | Status: AC | PRN

## 2014-04-04 MED ORDER — ACETAMINOPHEN 325 MG PO TABS: 650.0000 mg | ORAL_TABLET | ORAL | Status: DC | PRN

## 2014-04-04 MED ORDER — OXYTOCIN 40 UNITS IN LACTATED RINGERS INFUSION - SIMPLE MED
1.0000 m[IU]/min | INTRAVENOUS | Status: DC
  Administered 2014-04-04: 2 m[IU]/min via INTRAVENOUS
  Filled 2014-04-04: qty 1000

## 2014-04-04 MED ORDER — EPHEDRINE 5 MG/ML INJ
10.0000 mg | INTRAVENOUS | Status: DC | PRN
Start: 2014-04-04 — End: 2014-04-05
  Filled 2014-04-04: qty 2

## 2014-04-04 MED ORDER — LACTATED RINGERS IV SOLN
500.0000 mL | Freq: Once | INTRAVENOUS | Status: AC
  Administered 2014-04-04: 1000 mL via INTRAVENOUS

## 2014-04-04 MED ORDER — PHENYLEPHRINE 40 MCG/ML (10ML) SYRINGE FOR IV PUSH (FOR BLOOD PRESSURE SUPPORT)
PREFILLED_SYRINGE | INTRAVENOUS | Status: AC
  Filled 2014-04-04: qty 10

## 2014-04-04 MED ORDER — FENTANYL 2.5 MCG/ML BUPIVACAINE 1/10 % EPIDURAL INFUSION (WH - ANES)
INTRAMUSCULAR | Status: AC
  Filled 2014-04-04: qty 125

## 2014-04-04 MED ORDER — LACTATED RINGERS IV SOLN
INTRAVENOUS | Status: DC
  Administered 2014-04-04 (×2): via INTRAVENOUS

## 2014-04-04 MED ORDER — IBUPROFEN 600 MG PO TABS
600.0000 mg | ORAL_TABLET | Freq: Four times a day (QID) | ORAL | Status: DC | PRN
  Administered 2014-04-05: 600 mg via ORAL
  Filled 2014-04-04: qty 1

## 2014-04-04 MED ORDER — EPHEDRINE 5 MG/ML INJ
10.0000 mg | INTRAVENOUS | Status: DC | PRN
  Filled 2014-04-04: qty 2

## 2014-04-04 NOTE — MAU Provider Note (Signed)
°  History     CSN: [id number]  Arrival date and time: 04/04/14 0909   None     Chief Complaint  Patient presents with   Contractions   HPI   G6P5 at 2828w2d who presents with contractions q637min since last night, although feels they were more frequent last night.  Wanted to make sure she wasn't in labor.  No LOF, no VB, +FM, no dyrsuria.  Past Medical History  Diagnosis Date   Medical history non-contributory   obesity GBS+  Past Surgical History  Procedure Laterality Date   No past surgeries      Family History  Problem Relation Age of Onset   Diabetes type I Father 2440   Hypertension Father     History  Substance Use Topics   Smoking status: Never Smoker    Smokeless tobacco: Never Used   Alcohol Use: No    Allergies: No Known Allergies  Prescriptions prior to admission  Medication Sig Dispense Refill   Prenatal Vit-Fe Fumarate-FA (PRENATAL MULTIVITAMIN) TABS tablet Take 1 tablet by mouth daily at 12 noon.        Review of Systems  Constitutional: Negative for fever, chills and weight loss.  Respiratory: Negative for cough.   Cardiovascular: Negative for leg swelling and PND.  Gastrointestinal: Negative for abdominal pain.  Genitourinary: Negative for dysuria, urgency and frequency.       +contractions   Physical Exam   Blood pressure 132/75, pulse 100, temperature 98.1 F (36.7 C), temperature source Oral, resp. rate 18, height 5\' 6"  (1.676 m), weight 128.822 kg (284 lb), last menstrual period 06/26/2013.  Physical Exam  Constitutional: She appears well-developed and well-nourished. No distress (very comfortable appearing).  HENT:  Head: Normocephalic and atraumatic.  Cardiovascular: Normal rate.  Exam reveals no friction rub.   No murmur heard. Respiratory: Breath sounds normal. No respiratory distress.  Genitourinary: Vagina normal. No vaginal discharge found.  3/50/-3  Skin: She is not diaphoretic.    Baseline 140, +accels, no  decels, rare contractions, minimal to moderate variability  MAU Course  Procedures none  MDM Encourage PO fluids BPP - ordered as strip noted to have minimal variability, although + accels  Assessment and Plan  No change in cervical exam in 3 days despite stripped membranes, will discharge.  Encourage  PO fluids, labor warnings reviewed.   Fredirick LatheKristy Acosta 04/04/2014, 10:03 AM

## 2014-04-04 NOTE — MAU Note (Signed)
Patient presents with complaint of contractions 7 minutes apart since yesterday.

## 2014-04-04 NOTE — Anesthesia Procedure Notes (Signed)
Epidural Patient location during procedure: OB  Staffing Anesthesiologist: Phillips GroutARIGNAN, Beth [name] Performed by: anesthesiologist   Preanesthetic Checklist Completed: patient identified, site marked, surgical consent, pre-op evaluation, timeout performed, IV checked, risks and benefits discussed and monitors and equipment checked  Epidural Patient position: sitting Prep: ChloraPrep Patient monitoring: heart rate, continuous pulse ox and blood pressure Approach: midline Location: L2-L3 Injection technique: LOR saline  Needle:  Needle type: Tuohy  Needle gauge: 17 G Needle length: 9 cm and 9 Needle insertion depth: 7 cm Catheter type: closed end flexible Catheter size: 20 Guage Catheter at skin depth: 12 cm Test dose: negative  Assessment Events: blood not aspirated, injection not painful, no injection resistance, negative IV test and no paresthesia  Additional Notes   Patient tolerated the insertion well without complications.

## 2014-04-04 NOTE — H&P (Signed)
Attestation of Attending Supervision of Fellow: Evaluation and management procedures were performed by the Fellow under my supervision and collaboration.  I have reviewed the Fellow's note and chart, and I agree with the management and plan.    

## 2014-04-04 NOTE — Discharge Instructions (Signed)

## 2014-04-04 NOTE — MAU Note (Signed)
Pt reports gush of fluid at 3:00

## 2014-04-04 NOTE — MAU Provider Note (Signed)
Attestation of Attending Supervision of Fellow: Evaluation and management procedures were performed by the Fellow under my supervision and collaboration.  I have reviewed the Fellow's note and chart, and I agree with the management and plan. BPP 8/8.

## 2014-04-04 NOTE — H&P (Signed)
LABOR ADMISSION HISTORY AND PHYSICAL  [place] Jam[place] C Dueitt is a 28 y.o. female [phone] with IUP at 3935w2d by LMP and 7w sono presenting with spontaneous rupture of membranes prior to arrival. Desires epidural, to breast feed, BTL consents signed.   [facility]  Prenatal History/Complications:  Past Medical History: Past Medical History  Diagnosis Date   Medical history non-contributory     Past Surgical History: Past Surgical History  Procedure Laterality Date   No past surgeries      Obstetrical History: OB History   Grav Para Term Preterm Abortions TAB SAB Ect Mult Living   6 5 5  0 0 0 0 0 0 5      Gynecological History: 2005 TSVD 6lb 5oz 2006 TSVD 9lb 8oz 2008 TSVD 8lb 23009 TSVD 9lb 2013 TSVD 7lb4oz 2015 current  Social History: History   Social History   Marital Status: Married    Spouse Name: N/A    Number of Children: N/A   Years of Education: N/A   Social History Main Topics   Smoking status: Never Smoker    Smokeless tobacco: Never Used   Alcohol Use: No   Drug Use: No   Sexual Activity: Yes    CopyBirth Control/ Protection: None   Other Topics Concern   None   Social History Narrative   None    Family History: Family History  Problem Relation Age of Onset   Diabetes type I Father 40   Hypertension Father     Allergies: No Known Allergies  Prescriptions prior to admission  Medication Sig Dispense Refill   Prenatal Vit-Fe Fumarate-FA (PRENATAL MULTIVITAMIN) TABS tablet Take 1 tablet by mouth daily.          Review of Systems   All systems reviewed and negative except as stated in HPI  Blood pressure 112/65, pulse 119, resp. rate 18, last menstrual period 06/26/2013, not currently breastfeeding. General appearance: alert, appears older than stated age and mild distress Lungs: clear to auscultation bilaterally Heart: regular rate and rhythm Abdomen: soft, non-tender; bowel sounds normal Pelvic:  3-4/60/-3 Extremities: Homans sign is negative, no sign of DVT DTR's normal Presentation: cephalic Fetal monitoringBaseline: 145 bpm Uterine activity: rare poor tracing      Prenatal labs: ABO, Rh: B/POS/-- (12/24 1013) Antibody: NEG (12/24 1013) Rubella:   RPR: NON REAC (04/27 1535)  HBsAg: NEGATIVE (12/24 1013)  HIV: NONREACTIVE (04/27 1535)  GBS: Positive (07/18 0000)  1 hr Glucola 113 Genetic screening  negative Anatomy US normal but limited views   Prenatal Transfer Tool  Maternal Diabetes: No Genetic Screening: Normal Maternal Ultrasounds/Referrals: Normal Fetal Ultrasounds or other Referrals:  None Maternal Substance Abuse:  No Significant Maternal Medications:  None Significant Maternal Lab Results: Lab values include: Group B Strep positive     Results for orders placed during the hospital encounter of 04/04/14 (from the past 24 hour(s))  OB RESULTS CONSOLE GBS   Collection Time    04/04/14 12:00 AM      Result Value Ref Range   GBS Positive      Assessment: [place] Jam[place] C Sammarco is a 28 y.o. G6P5005 at 3135w2d here for SROM, meconium   #Labor: will start pitocin for augmentation given inadequate contraction pattern #Pain: Epidural prn #FWB: Moderate variability, + accels, no decels, Cat1 #ID:  PCN #MOF: breast #MOC: BTL, consents signed #Circ:  Will address with patient  Fredirick LatheKristy Acosta 04/04/2014, 5:02 PM

## 2014-04-04 NOTE — Anesthesia Preprocedure Evaluation (Signed)
Anesthesia Evaluation  Patient identified by MRN, date of birth, ID band Patient awake    Reviewed: Allergy & Precautions, H&P , NPO status , Patient's Chart, lab work & pertinent test results  History of Anesthesia Complications Negative for: history of anesthetic complications  Airway Mallampati: II TM Distance: >3 FB Neck ROM: full    Dental no notable dental hx. (+) Teeth Intact   Pulmonary neg pulmonary ROS,  breath sounds clear to auscultation  Pulmonary exam normal       Cardiovascular negative cardio ROS  Rhythm:regular Rate:Normal     Neuro/Psych negative neurological ROS  negative psych ROS   GI/Hepatic negative GI ROS, Neg liver ROS,   Endo/Other  negative endocrine ROSMorbid obesity  Renal/GU negative Renal ROS  negative genitourinary   Musculoskeletal   Abdominal Normal abdominal exam  (+)   Peds  Hematology negative hematology ROS (+)   Anesthesia Other Findings   Reproductive/Obstetrics (+) Pregnancy                           Anesthesia Physical Anesthesia Plan  ASA: III  Anesthesia Plan: Epidural   Post-op Pain Management:    Induction:   Airway Management Planned:   Additional Equipment:   Intra-op Plan:   Post-operative Plan:   Informed Consent: I have reviewed the patients History and Physical, chart, labs and discussed the procedure including the risks, benefits and alternatives for the proposed anesthesia with the patient or authorized representative who has indicated his/her understanding and acceptance.     Plan Discussed with:   Anesthesia Plan Comments:         Anesthesia Quick Evaluation  

## 2014-04-05 ENCOUNTER — Encounter (HOSPITAL_COMMUNITY)
Admission: AD | Disposition: A | Discharge status: Home / Self Care | Payer: Self-pay | Source: Ambulatory Visit | Attending: Obstetrics & Gynecology

## 2014-04-05 ENCOUNTER — Inpatient Hospital Stay (HOSPITAL_COMMUNITY): Payer: Managed Care, Other (non HMO) | Admitting: Anesthesiology

## 2014-04-05 ENCOUNTER — Encounter (HOSPITAL_COMMUNITY): Payer: Self-pay

## 2014-04-05 ENCOUNTER — Encounter (HOSPITAL_COMMUNITY): Payer: Managed Care, Other (non HMO) | Admitting: Anesthesiology

## 2014-04-05 DIAGNOSIS — IMO0001 Reserved for inherently not codable concepts without codable children: Secondary | ICD-10-CM

## 2014-04-05 LAB — CBC
HCT: 29.5 % — ABNORMAL LOW (ref 36.0–46.0)
Hemoglobin: 9.8 g/dL — ABNORMAL LOW (ref 12.0–15.0)
MCH: 29 pg (ref 26.0–34.0)
MCHC: 33.2 g/dL (ref 30.0–36.0)
MCV: 87.3 fL (ref 78.0–100.0)
Platelets: 208 10*3/uL (ref 150–400)
RBC: 3.38 MIL/uL — ABNORMAL LOW (ref 3.87–5.11)
RDW: 14.3 % (ref 11.5–15.5)
WBC: 14.1 10*3/uL — AB (ref 4.0–10.5)

## 2014-04-05 LAB — SURGICAL PCR SCREEN
MRSA, PCR: NEGATIVE
STAPHYLOCOCCUS AUREUS: POSITIVE — AB

## 2014-04-05 SURGERY — LIGATION, FALLOPIAN TUBE, POSTPARTUM
Anesthesia: Epidural | Laterality: Bilateral

## 2014-04-05 MED ORDER — KETOROLAC TROMETHAMINE 60 MG/2ML IM SOLN
60.0000 mg | Freq: Once | INTRAMUSCULAR | Status: AC | PRN
  Filled 2014-04-05: qty 2

## 2014-04-05 MED ORDER — DIPHENHYDRAMINE HCL 50 MG/ML IJ SOLN: 25.0000 mg | INTRAMUSCULAR | Status: DC | PRN

## 2014-04-05 MED ORDER — ONDANSETRON HCL 4 MG PO TABS: 4.0000 mg | ORAL_TABLET | ORAL | Status: DC | PRN

## 2014-04-05 MED ORDER — MEPERIDINE HCL 25 MG/ML IJ SOLN: 6.2500 mg | INTRAMUSCULAR | Status: DC | PRN

## 2014-04-05 MED ORDER — SCOPOLAMINE 1 MG/3DAYS TD PT72
1.0000 | MEDICATED_PATCH | Freq: Once | TRANSDERMAL | Status: DC
  Filled 2014-04-05: qty 1

## 2014-04-05 MED ORDER — TETANUS-DIPHTH-ACELL PERTUSSIS 5-2.5-18.5 LF-MCG/0.5 IM SUSP: 0.5000 mL | Freq: Once | INTRAMUSCULAR | Status: DC

## 2014-04-05 MED ORDER — FENTANYL CITRATE 0.05 MG/ML IJ SOLN
INTRAMUSCULAR | Status: AC
  Filled 2014-04-05: qty 2

## 2014-04-05 MED ORDER — WITCH HAZEL-GLYCERIN EX PADS: 1.0000 "application " | MEDICATED_PAD | CUTANEOUS | Status: DC | PRN

## 2014-04-05 MED ORDER — DIBUCAINE 1 % RE OINT: 1.0000 "application " | TOPICAL_OINTMENT | RECTAL | Status: DC | PRN

## 2014-04-05 MED ORDER — DIPHENHYDRAMINE HCL 50 MG/ML IJ SOLN: 12.5000 mg | INTRAMUSCULAR | Status: DC | PRN

## 2014-04-05 MED ORDER — SIMETHICONE 80 MG PO CHEW: 80.0000 mg | CHEWABLE_TABLET | ORAL | Status: DC | PRN

## 2014-04-05 MED ORDER — FAMOTIDINE 20 MG PO TABS: 40.0000 mg | ORAL_TABLET | Freq: Once | ORAL | Status: DC

## 2014-04-05 MED ORDER — SODIUM CHLORIDE 0.9 % IJ SOLN: 3.0000 mL | INTRAMUSCULAR | Status: DC | PRN

## 2014-04-05 MED ORDER — DIPHENHYDRAMINE HCL 25 MG PO CAPS: 25.0000 mg | ORAL_CAPSULE | ORAL | Status: DC | PRN

## 2014-04-05 MED ORDER — OXYCODONE-ACETAMINOPHEN 5-325 MG PO TABS
1.0000 | ORAL_TABLET | ORAL | Status: DC | PRN
Start: 2014-04-05 — End: 2014-04-06

## 2014-04-05 MED ORDER — NALBUPHINE HCL 10 MG/ML IJ SOLN: 5.0000 mg | INTRAMUSCULAR | Status: DC | PRN

## 2014-04-05 MED ORDER — ONDANSETRON HCL 4 MG/2ML IJ SOLN
INTRAMUSCULAR | Status: AC
  Filled 2014-04-05: qty 2

## 2014-04-05 MED ORDER — IBUPROFEN 600 MG PO TABS
600.0000 mg | ORAL_TABLET | Freq: Four times a day (QID) | ORAL | Status: DC
  Administered 2014-04-05 – 2014-04-06 (×6): 600 mg via ORAL
  Filled 2014-04-05 (×5): qty 1

## 2014-04-05 MED ORDER — FAMOTIDINE 20 MG PO TABS
40.0000 mg | ORAL_TABLET | Freq: Once | ORAL | Status: AC
Start: 2014-04-05 — End: 2014-04-05
  Administered 2014-04-05: 40 mg via ORAL
  Filled 2014-04-05: qty 2

## 2014-04-05 MED ORDER — PROMETHAZINE HCL 25 MG/ML IJ SOLN: 6.2500 mg | INTRAMUSCULAR | Status: DC | PRN

## 2014-04-05 MED ORDER — OXYCODONE HCL 5 MG PO TABS: 5.0000 mg | ORAL_TABLET | Freq: Once | ORAL | Status: AC | PRN

## 2014-04-05 MED ORDER — LIDOCAINE-EPINEPHRINE (PF) 2 %-1:200000 IJ SOLN
INTRAMUSCULAR | Status: AC
  Filled 2014-04-05: qty 20

## 2014-04-05 MED ORDER — ZOLPIDEM TARTRATE 5 MG PO TABS: 5.0000 mg | ORAL_TABLET | Freq: Every evening | ORAL | Status: DC | PRN

## 2014-04-05 MED ORDER — METOCLOPRAMIDE HCL 10 MG PO TABS
10.0000 mg | ORAL_TABLET | Freq: Once | ORAL | Status: AC
  Administered 2014-04-05: 10 mg via ORAL
  Filled 2014-04-05: qty 1

## 2014-04-05 MED ORDER — KETOROLAC TROMETHAMINE 30 MG/ML IJ SOLN: 30.0000 mg | Freq: Four times a day (QID) | INTRAMUSCULAR | Status: AC | PRN

## 2014-04-05 MED ORDER — HYDROMORPHONE HCL PF 1 MG/ML IJ SOLN: 0.2500 mg | INTRAMUSCULAR | Status: DC | PRN

## 2014-04-05 MED ORDER — NALOXONE HCL 1 MG/ML IJ SOLN
1.0000 ug/kg/h | INTRAMUSCULAR | Status: DC | PRN
  Filled 2014-04-05: qty 2

## 2014-04-05 MED ORDER — METOCLOPRAMIDE HCL 10 MG PO TABS: 10.0000 mg | ORAL_TABLET | Freq: Once | ORAL | Status: DC

## 2014-04-05 MED ORDER — LACTATED RINGERS IV SOLN
INTRAVENOUS | Status: DC
  Administered 2014-04-05: 06:00:00 via INTRAVENOUS

## 2014-04-05 MED ORDER — CHLORHEXIDINE GLUCONATE CLOTH 2 % EX PADS
6.0000 | MEDICATED_PAD | Freq: Every day | CUTANEOUS | Status: DC
  Administered 2014-04-06: 6 via TOPICAL

## 2014-04-05 MED ORDER — BUPIVACAINE HCL (PF) 0.25 % IJ SOLN
INTRAMUSCULAR | Status: AC
  Filled 2014-04-05: qty 30

## 2014-04-05 MED ORDER — SENNOSIDES-DOCUSATE SODIUM 8.6-50 MG PO TABS
2.0000 | ORAL_TABLET | ORAL | Status: DC
  Administered 2014-04-06: 2 via ORAL
  Filled 2014-04-05: qty 2

## 2014-04-05 MED ORDER — ONDANSETRON HCL 4 MG/2ML IJ SOLN: 4.0000 mg | Freq: Three times a day (TID) | INTRAMUSCULAR | Status: DC | PRN

## 2014-04-05 MED ORDER — MUPIROCIN 2 % EX OINT
1.0000 "application " | TOPICAL_OINTMENT | Freq: Two times a day (BID) | CUTANEOUS | Status: DC
  Administered 2014-04-05 – 2014-04-06 (×3): 1 via NASAL
  Filled 2014-04-05: qty 22

## 2014-04-05 MED ORDER — NALOXONE HCL 0.4 MG/ML IJ SOLN: 0.4000 mg | INTRAMUSCULAR | Status: DC | PRN

## 2014-04-05 MED ORDER — ONDANSETRON HCL 4 MG/2ML IJ SOLN: 4.0000 mg | INTRAMUSCULAR | Status: DC | PRN

## 2014-04-05 MED ORDER — PRENATAL MULTIVITAMIN CH
1.0000 | ORAL_TABLET | Freq: Every day | ORAL | Status: DC
  Administered 2014-04-05 – 2014-04-06 (×2): 1 via ORAL
  Filled 2014-04-05: qty 1

## 2014-04-05 MED ORDER — LANOLIN HYDROUS EX OINT: TOPICAL_OINTMENT | CUTANEOUS | Status: DC | PRN

## 2014-04-05 MED ORDER — DIPHENHYDRAMINE HCL 25 MG PO CAPS: 25.0000 mg | ORAL_CAPSULE | Freq: Four times a day (QID) | ORAL | Status: DC | PRN

## 2014-04-05 MED ORDER — BENZOCAINE-MENTHOL 20-0.5 % EX AERO: 1.0000 "application " | INHALATION_SPRAY | CUTANEOUS | Status: DC | PRN

## 2014-04-05 MED ORDER — OXYCODONE HCL 5 MG/5ML PO SOLN: 5.0000 mg | Freq: Once | ORAL | Status: AC | PRN

## 2014-04-05 MED ORDER — METOCLOPRAMIDE HCL 5 MG/ML IJ SOLN: 10.0000 mg | Freq: Three times a day (TID) | INTRAMUSCULAR | Status: DC | PRN

## 2014-04-05 MED ORDER — SODIUM BICARBONATE 8.4 % IV SOLN
INTRAVENOUS | Status: AC
  Filled 2014-04-05: qty 50

## 2014-04-05 MED ORDER — MIDAZOLAM HCL 2 MG/2ML IJ SOLN
INTRAMUSCULAR | Status: AC
  Filled 2014-04-05: qty 2

## 2014-04-05 SURGICAL SUPPLY — 21 items
BLADE 11 SAFETY STRL DISP (BLADE) ×1 IMPLANT
CHLORAPREP W/TINT 26ML (MISCELLANEOUS) ×1 IMPLANT
CLOTH BEACON ORANGE TIMEOUT ST (SAFETY) ×1 IMPLANT
DRSG OPSITE POSTOP 3X4 (GAUZE/BANDAGES/DRESSINGS) ×1 IMPLANT
GLOVE BIO SURGEON STRL SZ 6.5 (GLOVE) ×1 IMPLANT
GLOVE BIO SURGEONS STRL SZ 6.5 (GLOVE)
GLOVE BIOGEL PI IND STRL 7.0 (GLOVE) ×1 IMPLANT
GLOVE BIOGEL PI INDICATOR 7.0 (GLOVE)
GOWN STRL REUS W/TWL LRG LVL3 (GOWN DISPOSABLE) ×2 IMPLANT
NDL HYPO 25X1 1.5 SAFETY (NEEDLE) ×1 IMPLANT
NEEDLE HYPO 25X1 1.5 SAFETY (NEEDLE) IMPLANT
NS IRRIG 1000ML POUR BTL (IV SOLUTION) ×1 IMPLANT
PACK ABDOMINAL MINOR (CUSTOM PROCEDURE TRAY) ×1 IMPLANT
SPONGE LAP 4X18 X RAY DECT (DISPOSABLE) IMPLANT
SUT VIC AB 0 CT1 27 (SUTURE)
SUT VIC AB 0 CT1 27XBRD ANBCTR (SUTURE) ×1 IMPLANT
SUT VICRYL 4-0 PS2 18IN ABS (SUTURE) ×1 IMPLANT
SYR CONTROL 10ML LL (SYRINGE) ×1 IMPLANT
TOWEL OR 17X24 6PK STRL BLUE (TOWEL DISPOSABLE) ×2 IMPLANT
TRAY FOLEY BAG SILVER LF 14FR (CATHETERS) ×1 IMPLANT
WATER STERILE IRR 1000ML POUR (IV SOLUTION) ×1 IMPLANT

## 2014-04-05 NOTE — Progress Notes (Signed)
Post Partum Day 1  Subjective: no complaints, tolerating PO and decided against BTL, plans Nexplanon  Objective: Blood pressure 121/74, pulse 70, temperature 98.1 F (36.7 C), temperature source Oral, resp. rate 18, last menstrual period 06/26/2013, SpO2 100.00%, unknown if currently breastfeeding.  Physical Exam:  General: alert, cooperative and no distress Lochia: appropriate Uterine Fundus:   DVT Evaluation: No evidence of DVT seen on physical exam.   Recent Labs  04/04/14 1705 04/05/14 0552  HGB 10.8* 9.8*  HCT 32.9* 29.5*    Assessment/Plan: Contraception Nexplanon   LOS: 1 day   ARNOLD,JAMES 04/05/2014, 9:13 AM

## 2014-04-05 NOTE — Progress Notes (Addendum)
Arrived in mom's room to find her dozing again while holding 9lbs+ baby in her arms following a breastfeeding session.  Reminded mom to place infant in crib on his back for his safety. Mom verbalized understanding. Infant easily arousable with check by nsy RN for RR.  Respirations have been shallow, irregular without grunting, flaring or retracting 58-70/min [facility]

## 2014-04-05 NOTE — Anesthesia Preprocedure Evaluation (Deleted)
Anesthesia Evaluation  Patient identified by MRN, date of birth, ID band Patient awake    Reviewed: Allergy & Precautions, H&P , NPO status , Patient's Chart, lab work & pertinent test results  History of Anesthesia Complications Negative for: history of anesthetic complications  Airway Mallampati: II TM Distance: >3 FB Neck ROM: full    Dental no notable dental hx. (+) Teeth Intact   Pulmonary neg pulmonary ROS,  breath sounds clear to auscultation  Pulmonary exam normal       Cardiovascular negative cardio ROS  Rhythm:regular Rate:Normal     Neuro/Psych negative neurological ROS  negative psych ROS   GI/Hepatic negative GI ROS, Neg liver ROS,   Endo/Other  negative endocrine ROSMorbid obesity  Renal/GU negative Renal ROS  negative genitourinary   Musculoskeletal   Abdominal Normal abdominal exam  (+)   Peds  Hematology negative hematology ROS (+)   Anesthesia Other Findings   Reproductive/Obstetrics (+) Pregnancy                           Anesthesia Physical  Anesthesia Plan  ASA: III  Anesthesia Plan: Epidural   Post-op Pain Management:    Induction:   Airway Management Planned:   Additional Equipment:   Intra-op Plan:   Post-operative Plan:   Informed Consent: I have reviewed the patients History and Physical, chart, labs and discussed the procedure including the risks, benefits and alternatives for the proposed anesthesia with the patient or authorized representative who has indicated his/her understanding and acceptance.   Dental advisory given  Plan Discussed with: CRNA  Anesthesia Plan Comments:         Anesthesia Quick Evaluation

## 2014-04-05 NOTE — Anesthesia Postprocedure Evaluation (Signed)
Anesthesia Post Note  Patient: Beth [name]  Procedure(s) Performed: * No procedures listed *  Anesthesia type: Epidural  Patient location: Mother/Baby  Post pain: Pain level controlled  Post assessment: Post-op Vital signs reviewed  Last Vitals:  Filed Vitals:   04/05/14 0619  BP: 121/74  Pulse: 70  Temp: 36.7 C  Resp: 18    Post vital signs: Reviewed  Level of consciousness:alert  Complications: No apparent anesthesia complications

## 2014-04-06 ENCOUNTER — Encounter: Payer: Managed Care, Other (non HMO) | Admitting: Obstetrics & Gynecology

## 2014-04-06 MED ORDER — IBUPROFEN 600 MG PO TABS: 600.0000 mg | ORAL_TABLET | Freq: Four times a day (QID) | ORAL | Status: DC

## 2014-04-06 NOTE — Procedures (Deleted)
Attestation of Attending Supervision of Resident: Evaluation and management procedures were performed [facility] under my supervision.  I have seen and examined the patient, reviewed the resident's note and chart, and I agree with the management and plan.  Anibal Hendersonarolyn L Harraway-Smith, M.D. 04/06/2014 1:53 PM

## 2014-04-06 NOTE — Discharge Instructions (Signed)
[facility] Ask any questions about feeding, diapering, and baby care before you leave the hospital. Ask again if you do not understand. Ask when you need to see the doctor again. There are several things you must have before your baby comes home.  Infant car seat.  Crib.  Do not let your baby sleep in a bed with you or anyone else.  If you do not have a bed for your baby, ask the doctor what you can use that will be safe for the baby to sleep in. Infant feeding supplies:  6 to 8 bottles (8 oz. size).  6 to 8 nipples.  Measuring cup.  Measuring tablespoon.  Bottle brush.  Sterilizer (or use any large pan or kettle with a lid).  Formula that contains iron.  A way to boil and cool water. Breastfeeding supplies:  Breast pump.  Nipple cream. Clothing:  24 to 36 cloth diapers and waterproof diaper covers or a box of disposable diapers. You may need as many as 10 to 12 diapers per day.  3 onesies (other clothing will depend on the time of year and the weather).  3 receiving blankets.  3 baby pajamas or gowns.  3 bibs. Bath equipment:  Mild soap.  Petroleum jelly. No baby oil or powder.  Soft cloth towel and wash cloth.  Cotton balls.  Separate bath basin for baby. Only sponge bathe until umbilical cord and circumcision are healed. Other supplies:  Thermometer and bulb syringe (ask the hospital to send them home with you). Ask your doctor about how you should take your baby's temperature.  One to two pacifiers. Prepare for an emergency:  Know how to get to the hospital and know where to admit your baby.  Put all doctor numbers near your house phone and in your cell phone if you have one. Prepare your family:  Talk with siblings about the baby coming home and how they feel about it.  Decide how you want to handle visitors and other family members.  Take offers for help with the baby. You will need time to adjust. Know when to call the doctor.   GET HELP RIGHT AWAY IF:  Your baby's temperature is greater than 100.4 F (38 C).  The softspot on your baby's head starts to bulge.  Your baby is crying with no tears or has no wet diapers for 6 hours.  Your baby has rapid breathing.  Your baby is not as alert. Document Released: 08/17/2008 Document Revised: 11/27/2011 Document Reviewed: 11/24/2010 [facility] Patient Information 2015 [place]. This information is not intended to replace advice given to you by your health care provider. Make sure you discuss any questions you have with your health care provider. Vaginal Delivery Care After Refer to this sheet in the next few weeks. These discharge instructions provide you with information on caring for yourself after delivery. Your caregiver may also give you specific instructions. Your treatment has been planned according to the most current medical practices available, but problems sometimes occur. Call your caregiver if you have any problems or questions after you go home. HOME CARE INSTRUCTIONS  Take over-the-counter or prescription medicines only as directed by your caregiver or pharmacist.  Do not drink alcohol, especially if you are breastfeeding or taking medicine to relieve pain.  Do not chew or smoke tobacco.  Do not use illegal drugs.  Continue to use good perineal care. Good perineal care includes:  Wiping your perineum from front to back.  Keeping your perineum clean.  Do not use tampons or douche until your caregiver says it is okay.  Shower, wash your hair, and take tub baths as directed by your caregiver.  Wear a well-fitting bra that provides breast support.  Eat healthy foods.  Drink enough fluids to keep your urine clear or pale yellow.  Eat high-fiber foods such as whole grain cereals and breads, brown rice, beans, and fresh fruits and vegetables every day. These foods may help prevent or relieve constipation.  Follow your cargiver's  recommendations regarding resumption of activities such as climbing stairs, driving, lifting, exercising, or traveling.  Talk to your caregiver about resuming sexual activities. Resumption of sexual activities is dependent upon your risk of infection, your rate of healing, and your comfort and desire to resume sexual activity.  Try to have someone help you with your household activities and your newborn for at least a few days after you leave the hospital.  Rest as much as possible. Try to rest or take a nap when your newborn is sleeping.  Increase your activities gradually.  Keep all of your scheduled postpartum appointments. It is very important to keep your scheduled follow-up appointments. At these appointments, your caregiver will be checking to make sure that you are healing physically and emotionally. SEEK MEDICAL CARE IF:   You are passing large clots from your vagina. Save any clots to show your caregiver.  You have a foul smelling discharge from your vagina.  You have trouble urinating.  You are urinating frequently.  You have pain when you urinate.  You have a change in your bowel movements.  You have increasing redness, pain, or swelling near your vaginal incision (episiotomy) or vaginal tear.  You have pus draining from your episiotomy or vaginal tear.  Your episiotomy or vaginal tear is separating.  You have painful, hard, or reddened breasts.  You have a severe headache.  You have blurred vision or see spots.  You feel sad or depressed.  You have thoughts of hurting yourself or your newborn.  You have questions about your care, the care of your newborn, or medicines.  You are dizzy or lightheaded.  You have a rash.  You have nausea or vomiting.  You were breastfeeding and have not had a menstrual period within 12 weeks after you stopped breastfeeding.  You are not breastfeeding and have not had a menstrual period by the 12th week after  delivery.  You have a fever. SEEK IMMEDIATE MEDICAL CARE IF:   You have persistent pain.  You have chest pain.  You have shortness of breath.  You faint.  You have leg pain.  You have stomach pain.  Your vaginal bleeding saturates two or more sanitary pads in 1 hour. MAKE SURE YOU:   Understand these instructions.  Will watch your condition.  Will get help right away if you are not doing well or get worse. Document Released: 09/01/2000 Document Revised: 05/29/2012 Document Reviewed: 05/01/2012 [facility] Patient Information 2015 [place], [place]. This information is not intended to replace advice given to you by your health care provider. Make sure you discuss any questions you have with your health care provider.

## 2014-04-06 NOTE — Procedures (Signed)
After informed consent was obtained, the patient was anesthetized with 1cc of 1% lidocaine without epi at 10 + 2 of base of penis. The patient was then prepped and draped in sterile fashion. Hemostats were clamped to the foreskin at 10 & 2. Blunt dissection of adhesions was performed. A hemostat was dorsally clamped 1/3 of the way down the foreskin. It was then cut with scissors and retracted. Further adhesions were then taken down with gauze. A 1.1 Gomco was applied in the usual fashion and left on for two minutes. Meanwhile, the foreskin was removed with scalpel. Gomco was removed, and hemostatsis was good. Hemostatic dressing was applied. The patient tolerated the procedure well and was returned to mother in good condition.  Dr. Erin FullingHarraway-Smith was present for the entire procedure.

## 2014-04-06 NOTE — Discharge Summary (Signed)
Obstetric Discharge Summary Reason for Admission: rupture of membranes Prenatal Procedures: NST Intrapartum Procedures: spontaneous vaginal delivery and GBS prophylaxis Postpartum Procedures: none Complications-Operative and Postpartum: none Hemoglobin  Date Value Ref Range Status  04/05/2014 9.8* 12.0 - 15.0 g/dL Final     HCT  Date Value Ref Range Status  04/05/2014 29.5* 36.0 - 46.0 % Final   Hospital Course:  [place] C Aydt is a 28 y.o. female [phone] with IUP at 7442w2d by LMP and 7w sono presenting with spontaneous rupture of membranes prior to arrival. Desires epidural, to breast feed.  Pt. Was admitted to L&D she progressed to NSVD without incident. Her intrapartum and postpartum course remained uncomplicated. She has since remained well, is tolerating po, has minimal pain, minimal bleeding. She is now ready for discharge. She initially desired bilateral tubal ligation, however has changed her mind and wishes to get a nexplenon for long term birth control.    Delivery Note  At 10:42pm a viable female was delivered via (Presentation:Occiput Posterior with Manual Version to anterior ). APGAR:8 ,9 ; weight pending .  Placenta status: intact ,spontaneous. Cord: 3vessel with the following complications: none .  Anesthesia: Epidural  Episiotomy: None  Lacerations: none  Suture Repair: None  Est. Blood Loss (mL): 200 cc  Mom to postpartum. Baby to Couplet care / Skin to Skin.  Called to delivery. Mother pushed over 15 min. Infant delivered to maternal abdomen. Delayed cord clamping performed. Cord clamped and cut. Active management of 3rd stage with traction and Pitocin. Placenta delivered intact with 3v cord. No tears. EBL 200cc. Counts correct. Hemostatic.  Marijose Curington, Hillery HunterCaleb G  04/04/2014, 10:54 PM  I was present for and supervised the delivery of this newborn. I agree with above documentation.  BECK, KELI L, MD  Physical Exam:  General: alert, cooperative and no distress Lochia:  appropriate Uterine Fundus: firm Incision: N/A DVT Evaluation: No evidence of DVT seen on physical exam. No cords or calf tenderness.  Discharge Diagnoses: Term Pregnancy-delivered  Discharge Information: Date: 04/06/2014 Activity: unrestricted and pelvic rest Diet: routine Medications: PNV and Ibuprofen Condition: stable Instructions: refer to practice specific booklet Discharge to: home Follow-up Information   [facility] Schedule an appointment as soon as possible for a visit in 4 weeks. (Postpartum follow up  / Contraception)    Specialty:  Obstetrics and Gynecology   Contact information:   [address] [place] [address] [phone]      Newborn Data: Live born female  Birth Weight: 9 lb 2 oz (4139 g) APGAR: 8, 9  Home with mother.  Kamal Jurgens, Hillery HunterCaleb G 04/06/2014, 6:33 AM

## 2014-04-06 NOTE — Lactation Note (Signed)
This note was copied from the chart of Boy [place] Jam[place] Lett. Lactation Consultation Note  Patient Name: Boy [place] Nicholl UJWJX'BToday's Date: 04/06/2014 Reason for consult: Follow-up assessment Per mom breast feeding is going well and no soreness. LC reviewed sore nipple and engorgement prevention and tx. Per mom has a DEBP at home . Mother informed of post-discharge support and given phone number to the lactation department, including services for phone call assistance; out-patient appointments; and breastfeeding support group. List of other breastfeeding resources in the community given in the handout. Encouraged mother to call for problems or concerns related to breastfeeding.   Maternal Data    Feeding    LATCH Score/Interventions                      Lactation Tools Discussed/Used Tools:  (per mom has a pump at home )   Consult Status Consult Status: Complete    Kathrin Greathouseorio, Sacora Hawbaker Ann 04/06/2014, 11:31 AM

## 2014-04-09 ENCOUNTER — Inpatient Hospital Stay (HOSPITAL_COMMUNITY): Admission: RE | Admit: 2014-04-09 | Payer: Managed Care, Other (non HMO) | Source: Ambulatory Visit

## 2014-05-15 ENCOUNTER — Encounter: Payer: Self-pay | Admitting: Family Medicine

## 2014-05-15 ENCOUNTER — Encounter: Payer: Self-pay | Admitting: *Deleted

## 2014-05-15 ENCOUNTER — Ambulatory Visit (INDEPENDENT_AMBULATORY_CARE_PROVIDER_SITE_OTHER): Payer: Managed Care, Other (non HMO) | Admitting: Family Medicine

## 2014-05-15 DIAGNOSIS — Z30013 Encounter for initial prescription of injectable contraceptive: Secondary | ICD-10-CM

## 2014-05-15 DIAGNOSIS — Z3049 Encounter for surveillance of other contraceptives: Secondary | ICD-10-CM

## 2014-05-15 MED ORDER — NORETHINDRONE 0.35 MG PO TABS: 1.0000 | ORAL_TABLET | Freq: Every day | ORAL | Status: DC

## 2014-05-15 MED ORDER — MEDROXYPROGESTERONE ACETATE 150 MG/ML IM SUSP
150.0000 mg | Freq: Once | INTRAMUSCULAR | Status: AC
  Administered 2014-05-15: 150 mg via INTRAMUSCULAR

## 2014-05-15 NOTE — Progress Notes (Signed)
  Subjective:     Beth [name] is a 28 y.o. female who presents for a postpartum visit. She is 6 weeks postpartum following a spontaneous vaginal delivery. I have fully reviewed the prenatal and intrapartum course. The delivery was at 40 gestational weeks. Outcome: spontaneous vaginal delivery. Anesthesia: epidural. Postpartum course has been uneventful. Baby's course has been normal. Baby is feeding by breast. Bleeding no bleeding. Bowel function is normal. Bladder function is normal. Patient is not sexually active. Contraception method is none. Postpartum depression screening: negative.  The following portions of the patient's history were reviewed and updated as appropriate: allergies, current medications, past family history, past medical history, past social history, past surgical history and problem list.  Review of Systems Pertinent items are noted in HPI.   Objective:    BP 125/86  Pulse 65  Ht  (1.676 m)  Wt 257 lb (116.574 kg)  BMI 41.50 kg/m2  Breastfeeding? Yes  General:  alert  Abdomen: soft, non-tender; bowel sounds normal; no masses,  no organomegaly        Assessment:     Normal postpartum exam. Pap smear not done at today's visit.   Plan:    1. Contraception: Depo-Provera injections and vasectomy 2. Pap due 10/2014 3. Follow up in: 3 months or as needed.

## 2014-05-15 NOTE — Addendum Note (Signed)
Addended by: Barbara Cower on: 05/15/2014 11:36 AM   Modules accepted: Orders

## 2014-05-15 NOTE — Patient Instructions (Addendum)
[facility]  Address: [address] [place], [place] [address]  [phone]  Contraception Choices Contraception (birth control) is the use of any methods or devices to prevent pregnancy. Below are some methods to help avoid pregnancy. HORMONAL METHODS   Contraceptive implant. This is a thin, plastic tube containing progesterone hormone. It does not contain estrogen hormone. Your health care provider inserts the tube in the inner part of the upper arm. The tube can remain in place for up to 3 years. After 3 years, the implant must be removed. The implant prevents the ovaries from releasing an egg (ovulation), thickens the cervical mucus to prevent sperm from entering the uterus, and thins the lining of the inside of the uterus.  Progesterone-only injections. These injections are given every 3 months by your health care provider to prevent pregnancy. This synthetic progesterone hormone stops the ovaries from releasing eggs. It also thickens cervical mucus and changes the uterine lining. This makes it harder for sperm to survive in the uterus.  Birth control pills. These pills contain estrogen and progesterone hormone. They work by preventing the ovaries from releasing eggs (ovulation). They also cause the cervical mucus to thicken, preventing the sperm from entering the uterus. Birth control pills are prescribed by a health care provider.Birth control pills can also be used to treat heavy periods.  Minipill. This type of birth control pill contains only the progesterone hormone. They are taken every day of each month and must be prescribed by your health care provider.  Birth control patch. The patch contains hormones similar to those in birth control pills. It must be changed once a week and is prescribed by a health care provider.  Vaginal ring. The ring contains hormones similar to those in birth control pills. It is left in the vagina for 3 weeks, removed for 1 week, and  then a new one is put back in place. The patient must be comfortable inserting and removing the ring from the vagina.A health care provider's prescription is necessary.  Emergency contraception. Emergency contraceptives prevent pregnancy after unprotected sexual intercourse. This pill can be taken right after sex or up to 5 days after unprotected sex. It is most effective the sooner you take the pills after having sexual intercourse. Most emergency contraceptive pills are available without a prescription. Check with your pharmacist. Do not use emergency contraception as your only form of birth control. BARRIER METHODS   Female condom. This is a thin sheath (latex or rubber) that is worn over the penis during sexual intercourse. It can be used with spermicide to increase effectiveness.  Female condom. This is a soft, loose-fitting sheath that is put into the vagina before sexual intercourse.  Diaphragm. This is a soft, latex, dome-shaped barrier that must be fitted by a health care provider. It is inserted into the vagina, along with a spermicidal jelly. It is inserted before intercourse. The diaphragm should be left in the vagina for 6 to 8 hours after intercourse.  Cervical cap. This is a round, soft, latex or plastic cup that fits over the cervix and must be fitted by a health care provider. The cap can be left in place for up to 48 hours after intercourse.  Sponge. This is a soft, circular piece of polyurethane foam. The sponge has spermicide in it. It is inserted into the vagina after wetting it and before sexual intercourse.  Spermicides. These are chemicals that kill or block sperm from entering the cervix and uterus. They  come in the form of creams, jellies, suppositories, foam, or tablets. They do not require a prescription. They are inserted into the vagina with an applicator before having sexual intercourse. The process must be repeated every time you have sexual intercourse. INTRAUTERINE  CONTRACEPTION  Intrauterine device (IUD). This is a T-shaped device that is put in a woman's uterus during a menstrual period to prevent pregnancy. There are 2 types:  Copper IUD. This type of IUD is wrapped in copper wire and is placed inside the uterus. Copper makes the uterus and fallopian tubes produce a fluid that kills sperm. It can stay in place for 10 years.  Hormone IUD. This type of IUD contains the hormone progestin (synthetic progesterone). The hormone thickens the cervical mucus and prevents sperm from entering the uterus, and it also thins the uterine lining to prevent implantation of a fertilized egg. The hormone can weaken or kill the sperm that get into the uterus. It can stay in place for 3-5 years, depending on which type of IUD is used. PERMANENT METHODS OF CONTRACEPTION  Female tubal ligation. This is when the woman's fallopian tubes are surgically sealed, tied, or blocked to prevent the egg from traveling to the uterus.  Hysteroscopic sterilization. This involves placing a small coil or insert into each fallopian tube. Your doctor uses a technique called hysteroscopy to do the procedure. The device causes scar tissue to form. This results in permanent blockage of the fallopian tubes, so the sperm cannot fertilize the egg. It takes about 3 months after the procedure for the tubes to become blocked. You must use another form of birth control for these 3 months.  Female sterilization. This is when the female has the tubes that carry sperm tied off (vasectomy).This blocks sperm from entering the vagina during sexual intercourse. After the procedure, the man can still ejaculate fluid (semen). NATURAL PLANNING METHODS  Natural family planning. This is not having sexual intercourse or using a barrier method (condom, diaphragm, cervical cap) on days the woman could become pregnant.  Calendar method. This is keeping track of the length of each menstrual cycle and identifying when you are  fertile.  Ovulation method. This is avoiding sexual intercourse during ovulation.  Symptothermal method. This is avoiding sexual intercourse during ovulation, using a thermometer and ovulation symptoms.  Post-ovulation method. This is timing sexual intercourse after you have ovulated. Regardless of which type or method of contraception you choose, it is important that you use condoms to protect against the transmission of sexually transmitted infections (STIs). Talk with your health care provider about which form of contraception is most appropriate for you. Document Released: 09/04/2005 Document Revised: 09/09/2013 Document Reviewed: 02/27/2013 [facility] Patient Information 2015 [place], [place]. This information is not intended to replace advice given to you by your health care provider. Make sure you discuss any questions you have with your health care provider.

## 2014-07-20 ENCOUNTER — Encounter: Payer: Self-pay | Admitting: Family Medicine

## 2014-07-29 ENCOUNTER — Other Ambulatory Visit (INDEPENDENT_AMBULATORY_CARE_PROVIDER_SITE_OTHER): Payer: Managed Care, Other (non HMO) | Admitting: *Deleted

## 2014-07-29 DIAGNOSIS — N390 Urinary tract infection, site not specified: Secondary | ICD-10-CM

## 2014-07-29 LAB — POCT URINALYSIS DIPSTICK
Bilirubin, UA: NEGATIVE
GLUCOSE UA: NEGATIVE
Ketones, UA: NEGATIVE
NITRITE UA: NEGATIVE
Spec Grav, UA: >=1.03
Urobilinogen, UA: NEGATIVE
pH, UA: 6

## 2014-07-29 MED ORDER — PHENAZOPYRIDINE HCL 200 MG PO TABS: 200.0000 mg | ORAL_TABLET | Freq: Three times a day (TID) | ORAL | Status: DC | PRN

## 2014-07-29 MED ORDER — CIPROFLOXACIN HCL 500 MG PO TABS: 500.0000 mg | ORAL_TABLET | Freq: Two times a day (BID) | ORAL | Status: DC

## 2014-07-29 NOTE — Addendum Note (Signed)
Addended by: Barbara CowerNOGUES, Nikola Blackston L on: 07/29/2014 04:32 PM   Modules accepted: Orders

## 2014-07-30 LAB — URINE CULTURE

## 2014-08-12 ENCOUNTER — Ambulatory Visit: Payer: Managed Care, Other (non HMO)

## 2014-08-17 ENCOUNTER — Telehealth: Payer: Self-pay | Admitting: *Deleted

## 2014-08-17 DIAGNOSIS — Z308 Encounter for other contraceptive management: Secondary | ICD-10-CM

## 2014-08-17 MED ORDER — MEDROXYPROGESTERONE ACETATE 150 MG/ML IM SUSP: 150.0000 mg | INTRAMUSCULAR | Status: DC

## 2014-08-17 NOTE — Telephone Encounter (Signed)
Patient needs a refill of her Depo Provera called into her pharmacy so she can pick it up and bring it in.

## 2014-08-21 ENCOUNTER — Ambulatory Visit (INDEPENDENT_AMBULATORY_CARE_PROVIDER_SITE_OTHER): Payer: Managed Care, Other (non HMO) | Admitting: *Deleted

## 2014-08-21 DIAGNOSIS — Z01812 Encounter for preprocedural laboratory examination: Secondary | ICD-10-CM

## 2014-08-21 DIAGNOSIS — Z3042 Encounter for surveillance of injectable contraceptive: Secondary | ICD-10-CM

## 2014-08-21 LAB — POCT URINE PREGNANCY: Preg Test, Ur: NEGATIVE

## 2014-08-21 MED ORDER — MEDROXYPROGESTERONE ACETATE 150 MG/ML IM SUSP
150.0000 mg | Freq: Once | INTRAMUSCULAR | Status: AC
  Administered 2014-08-21: 150 mg via INTRAMUSCULAR

## 2014-08-21 NOTE — Progress Notes (Signed)
Pt here today for her Depo Provera.  UPT negative today in office.

## 2014-10-01 ENCOUNTER — Encounter (HOSPITAL_COMMUNITY): Payer: Self-pay | Admitting: Obstetrics & Gynecology

## 2014-11-12 ENCOUNTER — Ambulatory Visit (INDEPENDENT_AMBULATORY_CARE_PROVIDER_SITE_OTHER): Payer: Managed Care, Other (non HMO) | Admitting: Obstetrics & Gynecology

## 2014-11-12 ENCOUNTER — Encounter: Payer: Self-pay | Admitting: Obstetrics & Gynecology

## 2014-11-12 VITALS — BP 131/78 | HR 78 | Ht 66.0 in | Wt 265.0 lb

## 2014-11-12 DIAGNOSIS — Z0000 Encounter for general adult medical examination without abnormal findings: Secondary | ICD-10-CM

## 2014-11-12 DIAGNOSIS — Z124 Encounter for screening for malignant neoplasm of cervix: Secondary | ICD-10-CM

## 2014-11-12 DIAGNOSIS — Z01419 Encounter for gynecological examination (general) (routine) without abnormal findings: Secondary | ICD-10-CM

## 2014-11-12 DIAGNOSIS — Z1151 Encounter for screening for human papillomavirus (HPV): Secondary | ICD-10-CM

## 2014-11-12 DIAGNOSIS — Z3042 Encounter for surveillance of injectable contraceptive: Secondary | ICD-10-CM

## 2014-11-12 NOTE — Patient Instructions (Signed)
Pap Test A Pap test is a procedure done in a clinic office to evaluate cells that are on the surface of the cervix. The cervix is the lower portion of the uterus and upper portion of the vagina. For some women, the cervical region has the potential to form cancer. With consistent evaluations by your caregiver, this type of cancer can be prevented.  If a Pap test is abnormal, it is most often a result of a previous exposure to human papillomavirus (HPV). HPV is a virus that can infect the cells of the cervix and cause dysplasia. Dysplasia is where the cells no longer look normal. If a woman has been diagnosed with high-grade or severe dysplasia, they are at higher risk of developing cervical cancer. People diagnosed with low-grade dysplasia should still be seen by their caregiver because there is a small chance that low-grade dysplasia could develop into cancer.  LET YOUR CAREGIVER KNOW ABOUT:  Recent sexually transmitted infection (STI) you have had.  Any new sex partners you have had.  History of previous abnormal Pap tests results.  History of previous cervical procedures you have had (colposcopy, biopsy, loop electrosurgical excision procedure [LEEP]).  Concerns you have had regarding unusual vaginal discharge.  History of pelvic pain.  Your use of birth control. BEFORE THE PROCEDURE  Ask your caregiver when to schedule your Pap test. It is best not to be on your period if your caregiver uses a wooden spatula to collect cells or applies cells to a glass slide. Newer techniques are not so sensitive to the timing of a menstrual cycle.  Do not douche or have sexual intercourse for 24 hours before the test.   Do not use vaginal creams or tampons for 24 hours before the test.   Empty your bladder just before the test to lessen any discomfort.  PROCEDURE You will lie on an exam table with your feet in stirrups. A warm metal or plastic instrument (speculum) is placed in your vagina. This  instrument allows your caregiver to see the inside of your vagina and look at your cervix. A small, plastic brush or wooden spatula is then used to collect cervical cells. These cells are placed in a lab specimen container. The cells are looked at under a microscope. A specialist will determine if the cells are normal.  AFTER THE PROCEDURE Make sure to get your test results.If your results come back abnormal, you may need further testing.  Document Released: 11/25/2002 Document Revised: 11/27/2011 Document Reviewed: 08/31/2011 [employer] Patient Information 2015 [employer]. This information is not intended to replace advice given to you by your health care provider. Make sure you discuss any questions you have with your health care provider.  

## 2014-11-12 NOTE — Addendum Note (Signed)
Addended by: Barbara CowerNOGUES, Lazariah Savard L on: 11/12/2014 11:22 AM   Modules accepted: Orders

## 2014-11-12 NOTE — Progress Notes (Signed)
Patient ID: Beth [place] K[place]man, female   DOB: 1985-12-27, 29 y.o.   MRN: [id number] Subjective:     Beth [name] is a 29 y.o. female here for a routine exam.  Current complaints: none.  Pt starting new job.  Needs PE.  No new complaints.  Using Depo Provera for contraception.    Gynecologic History No LMP recorded. Patient has had an injection. Contraception: Depo-Provera injections Last Pap: 10/2011. Results were: normal (yeast) Last mammogram: n/a.   Obstetric History OB History  Gravida Para Term Preterm AB SAB TAB Ectopic Multiple Living  0 0 0 0 0 0 6    # Outcome Date GA Lbr Len/2nd Weight Sex Delivery Anes PTL Lv  6 Term 04/04/14 [redacted]w[redacted]d 28:35 / 00:37 9 lb 2 oz (4.139 kg) Genella Mech EPI  Y  5 Term 06/07/12 [redacted]w[redacted]d 04:02 / 00:16 7 lb 4 oz (3.289 kg) F Vag-Spont None  Y  4 Term 2009 [redacted]w[redacted]d   M Vag-Spont EPI Alpha Gula  3 Term 2008 [redacted]w[redacted]d   F Vag-Spont EPI Alpha Gula  2 Term 2006 [redacted]w[redacted]d   M Vag-Spont EPI Alpha Gula  1 Term 2005 [redacted]w[redacted]d   M Vag-Spont EPI N Y     Past Medical History  Diagnosis Date  . Medical history non-contributory    Past Surgical History  Procedure Laterality Date  . No past surgeries     Current Outpatient Prescriptions on File Prior to Visit  Medication Sig Dispense Refill  . medroxyPROGESTERone (DEPO-PROVERA) 150 MG/ML injection Inject 1 mL (150 mg total) into the muscle every 3 (three) months. 1 mL 6   No current facility-administered medications on file prior to visit.       The following portions of the patient's history were reviewed and updated as appropriate: allergies, current medications, past family history, past medical history, past social history, past surgical history and problem list.  Review of Systems A comprehensive review of systems was negative.    Objective:    BP 131/78 mmHg  Pulse 78  Ht  (1.676 m)  Wt 265 lb (120.203 kg)  BMI 42.79 kg/m2  Breastfeeding? Yes  General Appearance:    Alert, cooperative, no distress, appears stated  age  Head:    Normocephalic, without obvious abnormality, atraumatic              Neck:   Supple, symmetrical, trachea midline, no adenopathy;    thyroid:  no enlargement/tenderness/nodules; no carotid   bruit or JVD  Back:     Symmetric, no curvature, ROM normal, no CVA tenderness  Lungs:     Clear to auscultation bilaterally, respirations unlabored  Chest Wall:    No tenderness or deformity   Heart:    Regular rate and rhythm, S1 and S2 normal, no murmur, rub   or gallop  Breast Exam:    No tenderness, masses, or nipple abnormality  Abdomen:     Soft, non-tender, bowel sounds active all four quadrants,    no masses, no organomegaly  Genitalia:    Normal female without lesion, discharge or tenderness GU: EGBUS: no lesions Vagina: no blood in vault Cervix: no lesion; no mucopurulent d/c Uterus: small, mobile Adnexa: no masses; normal tender    ]  Rectal:  not done  Extremities:   Extremities normal, atraumatic, no cyanosis or edema     Skin:   Skin color, texture, turgor normal, no rashes or lesions  Assessment:    Healthy female exam.    Plan:    Follow up in: 1 year.    F/u in 3 days for Depo and TB test.

## 2014-11-16 ENCOUNTER — Ambulatory Visit (INDEPENDENT_AMBULATORY_CARE_PROVIDER_SITE_OTHER): Payer: Managed Care, Other (non HMO) | Admitting: *Deleted

## 2014-11-16 DIAGNOSIS — Z3042 Encounter for surveillance of injectable contraceptive: Secondary | ICD-10-CM

## 2014-11-16 DIAGNOSIS — Z111 Encounter for screening for respiratory tuberculosis: Secondary | ICD-10-CM

## 2014-11-16 LAB — CYTOLOGY - PAP

## 2014-11-16 MED ORDER — MEDROXYPROGESTERONE ACETATE 150 MG/ML IM SUSP: 150.0000 mg | INTRAMUSCULAR | Status: DC

## 2014-11-16 MED ORDER — MEDROXYPROGESTERONE ACETATE 150 MG/ML IM SUSP
150.0000 mg | Freq: Once | INTRAMUSCULAR | Status: AC
  Administered 2014-11-16: 150 mg via INTRAMUSCULAR

## 2014-11-16 NOTE — Progress Notes (Signed)
Patient is here today for Depo Provera injection and TB test.  She will return to the clinic on Wednesday to have the TB test read.

## 2014-12-23 ENCOUNTER — Ambulatory Visit (INDEPENDENT_AMBULATORY_CARE_PROVIDER_SITE_OTHER): Payer: Managed Care, Other (non HMO) | Admitting: *Deleted

## 2014-12-23 DIAGNOSIS — Z111 Encounter for screening for respiratory tuberculosis: Secondary | ICD-10-CM | POA: Diagnosis not present

## 2014-12-23 NOTE — Progress Notes (Signed)
Patient is needing a current TB test for her new place of employment.

## 2015-02-08 ENCOUNTER — Telehealth: Payer: Self-pay | Admitting: *Deleted

## 2015-02-08 DIAGNOSIS — Z30013 Encounter for initial prescription of injectable contraceptive: Secondary | ICD-10-CM

## 2015-02-08 MED ORDER — MEDROXYPROGESTERONE ACETATE 150 MG/ML IM SUSP: 150.0000 mg | INTRAMUSCULAR | Status: DC

## 2015-02-08 NOTE — Telephone Encounter (Signed)
rx sent to pharmacy for patient to pick up.  She has appointment to restart her injections.

## 2015-02-11 ENCOUNTER — Ambulatory Visit: Payer: Managed Care, Other (non HMO)

## 2015-02-16 ENCOUNTER — Ambulatory Visit (INDEPENDENT_AMBULATORY_CARE_PROVIDER_SITE_OTHER): Payer: Managed Care, Other (non HMO) | Admitting: *Deleted

## 2015-02-16 DIAGNOSIS — Z3042 Encounter for surveillance of injectable contraceptive: Secondary | ICD-10-CM

## 2015-02-16 DIAGNOSIS — Z01812 Encounter for preprocedural laboratory examination: Secondary | ICD-10-CM | POA: Diagnosis not present

## 2015-02-16 LAB — POCT URINE PREGNANCY: PREG TEST UR: NEGATIVE

## 2015-02-16 MED ORDER — MEDROXYPROGESTERONE ACETATE 150 MG/ML IM SUSP
150.0000 mg | Freq: Once | INTRAMUSCULAR | Status: AC
  Administered 2015-02-16: 150 mg via INTRAMUSCULAR

## 2015-02-16 NOTE — Progress Notes (Signed)
Patient here today for a Depo Provera. UPT negative today in office.

## 2015-02-25 ENCOUNTER — Encounter (HOSPITAL_COMMUNITY): Payer: Self-pay | Admitting: Obstetrics & Gynecology

## 2015-03-14 ENCOUNTER — Encounter: Payer: Self-pay | Admitting: Emergency Medicine

## 2015-03-14 ENCOUNTER — Emergency Department
Admission: EM | Admit: 2015-03-14 | Discharge: 2015-03-14 | Disposition: A | Discharge status: Home / Self Care | Payer: Managed Care, Other (non HMO) | Attending: Emergency Medicine | Admitting: Emergency Medicine

## 2015-03-14 DIAGNOSIS — S161XXA Strain of muscle, fascia and tendon at neck level, initial encounter: Secondary | ICD-10-CM | POA: Insufficient documentation

## 2015-03-14 DIAGNOSIS — S29012A Strain of muscle and tendon of back wall of thorax, initial encounter: Secondary | ICD-10-CM | POA: Insufficient documentation

## 2015-03-14 DIAGNOSIS — Z7952 Long term (current) use of systemic steroids: Secondary | ICD-10-CM | POA: Insufficient documentation

## 2015-03-14 DIAGNOSIS — S0990XA Unspecified injury of head, initial encounter: Secondary | ICD-10-CM | POA: Diagnosis not present

## 2015-03-14 DIAGNOSIS — Y998 Other external cause status: Secondary | ICD-10-CM | POA: Insufficient documentation

## 2015-03-14 DIAGNOSIS — S29019A Strain of muscle and tendon of unspecified wall of thorax, initial encounter: Secondary | ICD-10-CM

## 2015-03-14 DIAGNOSIS — S199XXA Unspecified injury of neck, initial encounter: Secondary | ICD-10-CM | POA: Diagnosis present

## 2015-03-14 DIAGNOSIS — Y92410 Unspecified street and highway as the place of occurrence of the external cause: Secondary | ICD-10-CM | POA: Diagnosis not present

## 2015-03-14 DIAGNOSIS — Y9389 Activity, other specified: Secondary | ICD-10-CM | POA: Insufficient documentation

## 2015-03-14 MED ORDER — IBUPROFEN 800 MG PO TABS
ORAL_TABLET | ORAL | Status: AC
  Administered 2015-03-14: 800 mg via ORAL
  Filled 2015-03-14: qty 1

## 2015-03-14 MED ORDER — METHOCARBAMOL 500 MG PO TABS
1000.0000 mg | ORAL_TABLET | Freq: Once | ORAL | Status: AC
  Administered 2015-03-14: 1000 mg via ORAL

## 2015-03-14 MED ORDER — TRAMADOL HCL 50 MG PO TABS: 50.0000 mg | ORAL_TABLET | Freq: Four times a day (QID) | ORAL | Status: DC | PRN

## 2015-03-14 MED ORDER — METHOCARBAMOL 500 MG PO TABS
ORAL_TABLET | ORAL | Status: AC
  Administered 2015-03-14: 1000 mg via ORAL
  Filled 2015-03-14: qty 2

## 2015-03-14 MED ORDER — METHOCARBAMOL 750 MG PO TABS: 1500.0000 mg | ORAL_TABLET | Freq: Four times a day (QID) | ORAL | Status: DC

## 2015-03-14 MED ORDER — IBUPROFEN 800 MG PO TABS
800.0000 mg | ORAL_TABLET | Freq: Once | ORAL | Status: AC
  Administered 2015-03-14: 800 mg via ORAL

## 2015-03-14 MED ORDER — TRAMADOL HCL 50 MG PO TABS
ORAL_TABLET | ORAL | Status: AC
  Administered 2015-03-14: 50 mg via ORAL
  Filled 2015-03-14: qty 1

## 2015-03-14 MED ORDER — IBUPROFEN 800 MG PO TABS: 800.0000 mg | ORAL_TABLET | Freq: Three times a day (TID) | ORAL | Status: DC | PRN

## 2015-03-14 MED ORDER — TRAMADOL HCL 50 MG PO TABS
50.0000 mg | ORAL_TABLET | Freq: Once | ORAL | Status: AC
  Administered 2015-03-14: 50 mg via ORAL

## 2015-03-14 NOTE — ED Notes (Signed)
NAD noted at time of D/C. Pt refused wheelchair to the lobby.  
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
## 2015-05-17 ENCOUNTER — Ambulatory Visit (INDEPENDENT_AMBULATORY_CARE_PROVIDER_SITE_OTHER): Payer: Managed Care, Other (non HMO) | Admitting: *Deleted

## 2015-05-17 ENCOUNTER — Ambulatory Visit: Payer: Managed Care, Other (non HMO)

## 2015-05-17 DIAGNOSIS — Z3042 Encounter for surveillance of injectable contraceptive: Secondary | ICD-10-CM

## 2015-05-17 DIAGNOSIS — Z3049 Encounter for surveillance of other contraceptives: Secondary | ICD-10-CM | POA: Diagnosis not present

## 2015-05-17 MED ORDER — MEDROXYPROGESTERONE ACETATE 150 MG/ML IM SUSP
150.0000 mg | INTRAMUSCULAR | Status: DC
  Administered 2015-05-17 – 2016-04-13 (×4): 150 mg via INTRAMUSCULAR

## 2015-08-09 ENCOUNTER — Encounter: Payer: Self-pay | Admitting: *Deleted

## 2015-08-10 ENCOUNTER — Ambulatory Visit (INDEPENDENT_AMBULATORY_CARE_PROVIDER_SITE_OTHER): Payer: Managed Care, Other (non HMO) | Admitting: *Deleted

## 2015-08-10 DIAGNOSIS — Z3042 Encounter for surveillance of injectable contraceptive: Secondary | ICD-10-CM

## 2015-08-16 ENCOUNTER — Ambulatory Visit: Payer: Managed Care, Other (non HMO)

## 2015-10-26 ENCOUNTER — Ambulatory Visit (INDEPENDENT_AMBULATORY_CARE_PROVIDER_SITE_OTHER): Payer: Managed Care, Other (non HMO) | Admitting: *Deleted

## 2015-10-26 DIAGNOSIS — Z3042 Encounter for surveillance of injectable contraceptive: Secondary | ICD-10-CM | POA: Diagnosis not present

## 2015-10-26 MED ORDER — MEDROXYPROGESTERONE ACETATE 150 MG/ML IM SUSP
150.0000 mg | Freq: Once | INTRAMUSCULAR | Status: AC
  Administered 2015-10-26: 150 mg via INTRAMUSCULAR

## 2015-10-26 NOTE — Progress Notes (Signed)
Patient here for her Depo Provera today.

## 2015-10-29 ENCOUNTER — Ambulatory Visit (INDEPENDENT_AMBULATORY_CARE_PROVIDER_SITE_OTHER): Payer: Managed Care, Other (non HMO) | Admitting: *Deleted

## 2015-10-29 DIAGNOSIS — Z23 Encounter for immunization: Secondary | ICD-10-CM | POA: Diagnosis not present

## 2015-10-29 NOTE — Progress Notes (Signed)
Patient here today for a flu shot.

## 2015-12-21 ENCOUNTER — Telehealth: Payer: Self-pay | Admitting: *Deleted

## 2015-12-21 NOTE — Telephone Encounter (Signed)
Pt had TB skin test done on 11-16-14 which was documented under immunizations, the result of the TB skin test was also documented under the comment portion of the immunization administration as well, unable to print this section so pt could take to her employer for documentation.  Pt requested image to be emailed to [email or web address], sent information to the email provided.

## 2015-12-21 NOTE — Telephone Encounter (Signed)
-----   Message from Olevia BowensJacinda S Battle sent at 12/21/2015  1:50 PM EDT ----- Regarding: Results Has a TB skin test done back in 10/2014 and 12/2014 w/ Inetta Fermoina doesn't look like any result were documented. Need proof for school/employment. Wants to talk to a nurse. I feel like I have had this conversation with her several time already.

## 2016-01-25 ENCOUNTER — Ambulatory Visit: Payer: Managed Care, Other (non HMO) | Admitting: Family Medicine

## 2016-01-26 ENCOUNTER — Ambulatory Visit (INDEPENDENT_AMBULATORY_CARE_PROVIDER_SITE_OTHER): Payer: Managed Care, Other (non HMO) | Admitting: Obstetrics & Gynecology

## 2016-01-26 ENCOUNTER — Encounter: Payer: Self-pay | Admitting: Obstetrics & Gynecology

## 2016-01-26 VITALS — BP 113/71 | HR 73 | Resp 18 | Ht 66.0 in | Wt 268.0 lb

## 2016-01-26 DIAGNOSIS — Z3042 Encounter for surveillance of injectable contraceptive: Secondary | ICD-10-CM | POA: Diagnosis not present

## 2016-01-26 DIAGNOSIS — Z01419 Encounter for gynecological examination (general) (routine) without abnormal findings: Secondary | ICD-10-CM | POA: Diagnosis not present

## 2016-01-26 DIAGNOSIS — Z01812 Encounter for preprocedural laboratory examination: Secondary | ICD-10-CM | POA: Diagnosis not present

## 2016-01-26 LAB — POCT URINE PREGNANCY: Preg Test, Ur: NEGATIVE

## 2016-01-26 NOTE — Progress Notes (Signed)
Patient ID: Beth [name], female   DOB: 02/19/1986, 30 y.o.   MRN: [id number] Subjective:     Beth [name] is a 30 y.o. female here for a routine exam.  Current complaints: none.    Gynecologic History No LMP recorded. Patient has had an injection. Contraception: Depo-Provera injections Last Pap: 11/13/2014. Results were: normal Last mammogram: n/a.   Obstetric History OB History  Gravida Para Term Preterm AB SAB TAB Ectopic Multiple Living  6 6 6  0 0 0 0 0 0 6    # Outcome Date GA Lbr Len/2nd Weight Sex Delivery Anes PTL Lv  6 Term 04/04/14 6970w2d 28:35 / 00:37 9 lb 2 oz (4.139 kg) Genella MechM Vag-Spont EPI  Y  5 Term 06/07/12 1864w1d 04:02 / 00:16 7 lb 4 oz (3.289 kg) F Vag-Spont None  Y  4 Term 2009 6157w0d   M Vag-Spont EPI N Y  3 Term 2008 4372w0d   F Vag-Spont EPI N Y  2 Term 2006 2740w0d   M Vag-Spont EPI N Y  1 Term 2005 5428w0d   M Vag-Spont EPI N Y       The following portions of the patient's history were reviewed and updated as appropriate: allergies, current medications, past family history, past medical history, past social history, past surgical history and problem list.  Review of Systems Pertinent items are noted in HPI.    Objective:   BP 113/71 mmHg  Pulse 73  Resp 18  Ht 5\' 6"  (1.676 m)  Wt 268 lb (121.564 kg)  BMI 43.28 kg/m2 General Appearance:    Alert, cooperative, no distress, appears stated age  Head:    Normocephalic, without obvious abnormality, atraumatic  Eyes:    conjunctiva/corneas clear, EOM's intact, both eyes  Ears:    Normal external ear canals, both ears  Nose:   Nares normal, septum midline, mucosa normal, no drainage    or sinus tenderness  Throat:   Lips, mucosa, and tongue normal; teeth and gums normal  Neck:   Supple, symmetrical, trachea midline, no adenopathy;    thyroid:  no enlargement/tenderness/nodules  Back:     Symmetric, no curvature, ROM normal, no CVA tenderness  Lungs:     Clear to auscultation bilaterally, respirations unlabored   Chest Wall:    No tenderness or deformity   Heart:    Regular rate and rhythm, S1 and S2 normal, no murmur, rub   or gallop  Breast Exam:    No tenderness, masses, or nipple abnormality  Abdomen:     Soft, non-tender, bowel sounds active all four quadrants,    no masses, no organomegaly  Genitalia:    Normal female without lesion, discharge or tenderness     Extremities:   Extremities normal, atraumatic, no cyanosis or edema  Pulses:   2+ and symmetric all extremities  Skin:   Skin color, texture, turgor normal, no rashes or lesions    Assessment:    Healthy female exam. - no PAP done today.   Contraception counseling   Plan:    Contraception: Depo-Provera injections.    Make sure to keep Depo provera until no sperm noted after vasectomy   F/u in 1 year for Annual  Nour Rodrigues L. Harraway-Smith, M.D., Evern CoreFACOG

## 2016-01-26 NOTE — Patient Instructions (Signed)

## 2016-01-26 NOTE — Progress Notes (Signed)
Pt here today for annual exam and Depo Provera injection, pt has no complaints at this time.

## 2016-02-09 ENCOUNTER — Telehealth: Payer: Self-pay | Admitting: *Deleted

## 2016-02-09 NOTE — Telephone Encounter (Signed)
-----   Message from Olevia BowensJacinda S Battle sent at 02/09/2016  3:26 PM EDT ----- Regarding: advise Wants to talk to you about break through bleeding she is having w/ the depo

## 2016-02-09 NOTE — Telephone Encounter (Signed)
Pt called stating she is experiencing some break through bleeding.  Has been on the Depo for 2 years and never experienced break through bleeding, last injection was given off schedule.  Informed pt that the bleeding could have been a result from receiving her shot late and to give it another week or so to adjust.  Pt acknowledged instructions.

## 2016-04-12 ENCOUNTER — Ambulatory Visit: Payer: Managed Care, Other (non HMO)

## 2016-04-13 ENCOUNTER — Ambulatory Visit (INDEPENDENT_AMBULATORY_CARE_PROVIDER_SITE_OTHER): Payer: Managed Care, Other (non HMO) | Admitting: *Deleted

## 2016-04-13 DIAGNOSIS — Z3042 Encounter for surveillance of injectable contraceptive: Secondary | ICD-10-CM | POA: Diagnosis not present

## 2016-04-13 NOTE — Progress Notes (Signed)
Pt here today for Depo Provera 150mg , last injection given on 01-26-16.  Pt husband just had a vasectomy so this will be her last injection.

## 2016-06-16 ENCOUNTER — Other Ambulatory Visit: Payer: Managed Care, Other (non HMO)

## 2017-04-03 ENCOUNTER — Emergency Department
Admission: EM | Admit: 2017-04-03 | Discharge: 2017-04-03 | Disposition: A | Discharge status: Home / Self Care | Payer: 59 | Attending: Student in an Organized Health Care Education/Training Program | Admitting: Student in an Organized Health Care Education/Training Program

## 2017-04-03 ENCOUNTER — Encounter: Payer: Self-pay | Admitting: Emergency Medicine

## 2017-04-03 DIAGNOSIS — K029 Dental caries, unspecified: Secondary | ICD-10-CM | POA: Insufficient documentation

## 2017-04-03 DIAGNOSIS — K0889 Other specified disorders of teeth and supporting structures: Secondary | ICD-10-CM | POA: Diagnosis present

## 2017-04-03 MED ORDER — AMOXICILLIN 500 MG PO CAPS: 500.0000 mg | ORAL_CAPSULE | Freq: Three times a day (TID) | ORAL | 0 refills | Status: DC

## 2017-04-03 MED ORDER — TRAMADOL HCL 50 MG PO TABS: 50.0000 mg | ORAL_TABLET | Freq: Four times a day (QID) | ORAL | 0 refills | Status: AC | PRN

## 2017-04-03 MED ORDER — IBUPROFEN 600 MG PO TABS: 600.0000 mg | ORAL_TABLET | Freq: Three times a day (TID) | ORAL | 0 refills | Status: DC | PRN

## 2017-04-03 NOTE — ED Provider Notes (Signed)
[facility] Emergency Department Provider Note   ____________________________________________   First MD Initiated Contact with Patient 04/03/17 [phone]     (approximate)  I have reviewed the triage vital signs and the nursing notes.   HISTORY  Chief Complaint Oral Swelling    HPI [name] is a 31 y.o. female patient complain right lower lateral dental pain secondary to caries. Patient state recently receiving showings but cannot see dentist for at least another month. Patient rates her painas 8/10. Patient described a pain as "achy". No relief over-the-counter anti-inflammatory medications.   Past Medical History:  Diagnosis Date  . Medical history non-contributory     There are no active problems to display for this patient.   Past Surgical History:  Procedure Laterality Date  . NO PAST SURGERIES      Prior to Admission medications   Medication Sig Start Date End Date Taking? Authorizing Provider  amoxicillin (AMOXIL) 500 MG capsule Take 1 capsule (500 mg total) by mouth 3 (three) times daily. 04/03/17   Joni Reining, PA-C  ibuprofen (ADVIL,MOTRIN) 600 MG tablet Take 1 tablet (600 mg total) by mouth every 8 (eight) hours as needed. 04/03/17   Joni Reining, PA-C  ibuprofen (ADVIL,MOTRIN) 800 MG tablet Take 1 tablet (800 mg total) by mouth every 8 (eight) hours as needed for moderate pain. 03/14/15   Joni Reining, PA-C  medroxyPROGESTERone (DEPO-PROVERA) 150 MG/ML injection Inject 1 mL (150 mg total) into the muscle every 3 (three) months. 02/08/15   Constant, Peggy, MD  traMADol (ULTRAM) 50 MG tablet Take 1 tablet (50 mg total) by mouth every 6 (six) hours as needed. 04/03/17 04/03/18  Joni Reining, PA-C    Allergies Patient has no known allergies.  Family History  Problem Relation Age of Onset  . Diabetes type I Father 65  . Hypertension Father     Social History Social History  Substance Use Topics  . Smoking status:  Never Smoker  . Smokeless tobacco: Never Used  . Alcohol use No    Review of Systems Constitutional: No fever/chills Eyes: No visual changes. ENT: No sore throat.Dental pain Cardiovascular: Denies chest pain. Respiratory: Denies shortness of breath. Gastrointestinal: No abdominal pain.  No nausea, no vomiting.  No diarrhea.  No constipation. Genitourinary: Negative for dysuria. Musculoskeletal: Negative for back pain. Skin: Negative for rash. Neurological: Negative for headaches, focal weakness or numbness.   ____________________________________________   PHYSICAL EXAM:  VITAL SIGNS: ED Triage Vitals  Enc Vitals Group     BP 04/03/17 0636 128/71     Pulse Rate 04/03/17 0636 84     Resp 04/03/17 0636 18     Temp 04/03/17 0636 98.7 F (37.1 C)     Temp Source 04/03/17 0636 Oral     SpO2 04/03/17 0636 98 %     Weight 04/03/17 0637 215 lb (97.5 kg)     Height 04/03/17 0637 5\' 6"  (1.676 m)     Head Circumference --      Peak Flow --      Pain Score 04/03/17 0635 8     Pain Loc --      Pain Edu? --      Excl. in GC? --     Constitutional: Alert and oriented. Well appearing and in no acute distress. Mouth/Throat: Mucous membranes are moist.  Oropharynx non-erythematous. Devitalized tooth #28 and 29 with mild gingival edema. Neck: No stridor.   Cardiovascular: Normal rate, regular rhythm.  Grossly normal heart sounds.  Good peripheral circulation. Respiratory: Normal respiratory effort.  No retractions. Lungs CTAB. Gastrointestinal: Soft and nontender. No distention. No abdominal bruits. No CVA tenderness. Musculoskeletal: No lower extremity tenderness nor edema.  No joint effusions. Neurologic:  Normal speech and language. No gross focal neurologic deficits are appreciated. No gait instability. Skin:  Skin is warm, dry and intact. No rash noted. Psychiatric: Mood and affect are normal. Speech and behavior are normal.  ____________________________________________    LABS (all labs ordered are listed, but only abnormal results are displayed)  Labs Reviewed - No data to display ____________________________________________  EKG   ____________________________________________  RADIOLOGY  No results found.  ____________________________________________   PROCEDURES  Procedure(s) performed: None  Procedures  Critical Care performed: No  ____________________________________________   INITIAL IMPRESSION / ASSESSMENT AND PLAN / ED COURSE  Pertinent labs & imaging results that were available during my care of the patient were reviewed by me and considered in my medical decision making (see chart for details).  Dental pain secondary to multiple caries. Patient given discharge Instructions. Patient advised to follow with a walk-in dental clinic.      ____________________________________________   FINAL CLINICAL IMPRESSION(S) / ED DIAGNOSES  Final diagnoses:  Pain due to dental caries      NEW MEDICATIONS STARTED DURING THIS VISIT:  Discharge Medication List as of 04/03/2017  7:33 AM    START taking these medications   Details  amoxicillin (AMOXIL) 500 MG capsule Take 1 capsule (500 mg total) by mouth 3 (three) times daily., Starting Tue 04/03/2017, Print    !! ibuprofen (ADVIL,MOTRIN) 600 MG tablet Take 1 tablet (600 mg total) by mouth every 8 (eight) hours as needed., Starting Tue 04/03/2017, Print    traMADol (ULTRAM) 50 MG tablet Take 1 tablet (50 mg total) by mouth every 6 (six) hours as needed., Starting Tue 04/03/2017, Until Wed 04/03/2018, Print     !! - Potential duplicate medications found. Please discuss with provider.       Note:  This document was prepared using Dragon voice recognition software and may include unintentional dictation errors.    Joni ReiningSmith, Nino Amano K, PA-C 04/03/17 0737    Joni ReiningSmith, Rudra Hobbins K, PA-C 04/03/17 1203    Willy Eddyobinson, Patrick, MD 04/04/17 [phone]

## 2017-04-03 NOTE — ED Notes (Signed)
See triage note   Presents with swelling to right side of face  Dental pain  With gum swelling  States she thinks she lost a filling

## 2017-04-03 NOTE — ED Triage Notes (Signed)
Pt arrived to the ED for complains of dental pain and facial swelling. Pt is AOx4 in no apparent distress.

## 2017-07-20 ENCOUNTER — Ambulatory Visit (INDEPENDENT_AMBULATORY_CARE_PROVIDER_SITE_OTHER): Payer: 59 | Admitting: Obstetrics & Gynecology

## 2017-07-20 ENCOUNTER — Encounter: Payer: Self-pay | Admitting: Obstetrics & Gynecology

## 2017-07-20 VITALS — BP 116/73 | HR 78 | Wt 236.8 lb

## 2017-07-20 DIAGNOSIS — Z124 Encounter for screening for malignant neoplasm of cervix: Secondary | ICD-10-CM

## 2017-07-20 DIAGNOSIS — Z23 Encounter for immunization: Secondary | ICD-10-CM

## 2017-07-20 DIAGNOSIS — Z01419 Encounter for gynecological examination (general) (routine) without abnormal findings: Secondary | ICD-10-CM | POA: Diagnosis not present

## 2017-07-20 DIAGNOSIS — Z113 Encounter for screening for infections with a predominantly sexual mode of transmission: Secondary | ICD-10-CM | POA: Diagnosis not present

## 2017-07-20 DIAGNOSIS — Z1151 Encounter for screening for human papillomavirus (HPV): Secondary | ICD-10-CM

## 2017-07-20 NOTE — Progress Notes (Signed)
Subjective:    Beth [name] is a 31 y.o. separated AA P6 female who presents for an annual exam. The patient has no complaints today. She recently found out that she is pregnant and doesn't want to continue this pregnancy. The patient is sexually active. GYN screening history: last pap: was normal. The patient wears seatbelts: yes. The patient participates in regular exercise: yes. Has the patient ever been transfused or tattooed?: no. The patient reports that there is not domestic violence in her life.   Menstrual History: OB History    Gravida Para Term Preterm AB Living   8 6 6  0 0 6   SAB TAB Ectopic Multiple Live Births   0 0 0 0 6      Menarche age: [age] Patient's last menstrual period was 03/15/2017 (approximate).    The following portions of the patient's history were reviewed and updated as appropriate: allergies, current medications, past family history, past medical history, past social history, past surgical history and problem list.  Review of Systems Pertinent items are noted in HPI.   Separated for 5 months, husband had a vasectomy Works [facility] [employer] FH- no breast/gyn/colon cancer   Objective:    BP 116/73   Pulse 78   Wt 236 lb 12.8 oz (107.4 kg)   LMP 03/15/2017 (Approximate)   BMI 38.22 kg/m   General Appearance:    Alert, cooperative, no distress, appears stated age  Head:    Normocephalic, without obvious abnormality, atraumatic  Eyes:    PERRL, conjunctiva/corneas clear, EOM's intact, fundi    benign, both eyes  Ears:    Normal TM's and external ear canals, both ears  Nose:   Nares normal, septum midline, mucosa normal, no drainage    or sinus tenderness  Throat:   Lips, mucosa, and tongue normal; teeth and gums normal  Neck:   Supple, symmetrical, trachea midline, no adenopathy;    thyroid:  no enlargement/tenderness/nodules; no carotid   bruit or JVD  Back:     Symmetric, no curvature, ROM normal, no CVA tenderness  Lungs:     Clear to  auscultation bilaterally, respirations unlabored  Chest Wall:    No tenderness or deformity   Heart:    Regular rate and rhythm, S1 and S2 normal, no murmur, rub   or gallop  Breast Exam:    No tenderness, masses, or nipple abnormality  Abdomen:     Soft, non-tender, bowel sounds active all four quadrants,    no masses, no organomegaly  Genitalia:    Normal female without lesion, discharge or tenderness, discharge frothy, white, 8 week size mobile uterus, non-enlarged adnexa     Extremities:   Extremities normal, atraumatic, no cyanosis or edema  Pulses:   2+ and symmetric all extremities  Skin:   Skin color, texture, turgor normal, no rashes or lesions  Lymph nodes:   Cervical, supraclavicular, and axillary nodes normal  Neurologic:   CNII-XII intact, normal strength, sensation and reflexes    throughout  .    Assessment:    Healthy female exam.   Undesired pregnancy   Plan:     Thin prep Pap smear.   STI testing Flu vaccine today Come back after AB for OCPs She is not interested in a BTL or IUD

## 2017-07-21 LAB — HIV ANTIBODY (ROUTINE TESTING W REFLEX): HIV Screen 4th Generation wRfx: NONREACTIVE

## 2017-07-21 LAB — HEPATITIS PANEL, ACUTE
HEP B S AG: NEGATIVE
Hep A IgM: NEGATIVE
Hep B C IgM: NEGATIVE
Hep C Virus Ab: <0.1 s/co ratio (ref 0.0–0.9)

## 2017-07-21 LAB — RPR: RPR Ser Ql: NONREACTIVE

## 2017-07-23 ENCOUNTER — Other Ambulatory Visit: Payer: Self-pay

## 2017-07-24 LAB — CYTOLOGY - PAP
BACTERIAL VAGINITIS: POSITIVE — AB
CHLAMYDIA, DNA PROBE: NEGATIVE
Candida vaginitis: NEGATIVE
DIAGNOSIS: NEGATIVE
HPV (WINDOPATH): DETECTED — AB
Neisseria Gonorrhea: NEGATIVE
Trichomonas: NEGATIVE

## 2017-07-25 ENCOUNTER — Telehealth: Payer: Self-pay | Admitting: *Deleted

## 2017-07-25 MED ORDER — METRONIDAZOLE 500 MG PO TABS: 500.0000 mg | ORAL_TABLET | Freq: Two times a day (BID) | ORAL | 0 refills | Status: DC

## 2017-07-25 NOTE — Telephone Encounter (Signed)
-----   Message from Allie BossierMyra C Dove, MD sent at 07/25/2017  3:42 PM EST ----- She has BV. Can you please prescribe flagyl. She will need a pap in a year. Thanks

## 2017-07-25 NOTE — Telephone Encounter (Signed)
Tried calling pt to go over labs but no answer/no VM. Sent meds to [facility]

## 2017-07-31 ENCOUNTER — Telehealth: Payer: Self-pay | Admitting: *Deleted

## 2017-07-31 MED ORDER — METRONIDAZOLE 500 MG PO TABS: 500.0000 mg | ORAL_TABLET | Freq: Two times a day (BID) | ORAL | 0 refills | Status: AC

## 2017-07-31 NOTE — Telephone Encounter (Signed)
-----   Message from Lindell SparHeather L Bacon, [place] sent at 07/30/2017  3:10 PM EST ----- Regarding: [facility] [address] [facility] [address] [facility]

## 2017-09-16 ENCOUNTER — Emergency Department: Payer: 59

## 2017-09-16 ENCOUNTER — Encounter: Payer: Self-pay | Admitting: Emergency Medicine

## 2017-09-16 ENCOUNTER — Emergency Department
Admission: EM | Admit: 2017-09-16 | Discharge: 2017-09-16 | Disposition: A | Discharge status: Home / Self Care | Payer: 59 | Attending: Emergency Medicine | Admitting: Emergency Medicine

## 2017-09-16 ENCOUNTER — Other Ambulatory Visit: Payer: Self-pay

## 2017-09-16 DIAGNOSIS — R0789 Other chest pain: Secondary | ICD-10-CM | POA: Insufficient documentation

## 2017-09-16 DIAGNOSIS — Z79899 Other long term (current) drug therapy: Secondary | ICD-10-CM | POA: Insufficient documentation

## 2017-09-16 LAB — URINALYSIS, COMPLETE (UACMP) WITH MICROSCOPIC
Bilirubin Urine: NEGATIVE
Glucose, UA: NEGATIVE mg/dL
Ketones, ur: NEGATIVE mg/dL
Nitrite: NEGATIVE
PROTEIN: NEGATIVE mg/dL
Specific Gravity, Urine: 1.025 (ref 1.005–1.030)
pH: 6 (ref 5.0–8.0)

## 2017-09-16 LAB — BASIC METABOLIC PANEL
Anion gap: 8 (ref 5–15)
BUN: 11 mg/dL (ref 6–20)
CHLORIDE: 105 mmol/L (ref 101–111)
CO2: 27 mmol/L (ref 22–32)
CREATININE: 0.68 mg/dL (ref 0.44–1.00)
Calcium: 9.2 mg/dL (ref 8.9–10.3)
GFR calc Af Amer: >60 mL/min (ref >60)
GFR calc non Af Amer: >60 mL/min (ref >60)
GLUCOSE: 106 mg/dL — AB (ref 65–99)
POTASSIUM: 3.3 mmol/L — AB (ref 3.5–5.1)
Sodium: 140 mmol/L (ref 135–145)

## 2017-09-16 LAB — CBC
HEMATOCRIT: 36.6 % (ref 35.0–47.0)
Hemoglobin: 12.1 g/dL (ref 12.0–16.0)
MCH: 30.1 pg (ref 26.0–34.0)
MCHC: 33 g/dL (ref 32.0–36.0)
MCV: 91 fL (ref 80.0–100.0)
PLATELETS: 258 10*3/uL (ref 150–440)
RBC: 4.02 MIL/uL (ref 3.80–5.20)
RDW: 12.6 % (ref 11.5–14.5)
WBC: 6.8 10*3/uL (ref 3.6–11.0)

## 2017-09-16 LAB — POCT PREGNANCY, URINE: Preg Test, Ur: NEGATIVE

## 2017-09-16 LAB — TROPONIN I: Troponin I: <0.03 ng/mL (ref <0.03)

## 2017-09-16 NOTE — ED Provider Notes (Signed)
[facility] Emergency Department Provider Note    First MD Initiated Contact with Patient 09/16/17 [phone]     (approximate)  I have reviewed the triage vital signs and the nursing notes.   HISTORY  Chief Complaint Chest Pain    HPI Beth [name] is a 31 y.o. female with no past medical history presents to the emergency department with nonradiating upper bilateral chest discomfort that began before arrival.  Patient states she was having a verbal disagreement with someone at the time of onset of pain.  Patient denies any dyspnea no lower externally pain or swelling.  Patient denies any personal family history of CAD DVT or PE.  Patient states that pain has improved since onset.  She describes the pain as being sharp in nature.   Past Medical History:  Diagnosis Date  . Medical history non-contributory     There are no active problems to display for this patient.   Past Surgical History:  Procedure Laterality Date  . NO PAST SURGERIES      Prior to Admission medications   Medication Sig Start Date End Date Taking? Authorizing Provider  SPRINTEC 28 0.25-35 MG-MCG tablet Take 1 tablet by mouth daily.   Yes [provider]  amoxicillin (AMOXIL) 500 MG capsule Take 1 capsule (500 mg total) by mouth 3 (three) times daily. Patient not taking: Reported on 07/20/2017 04/03/17   Joni ReiningSmith, Ronald K, PA-C  ibuprofen (ADVIL,MOTRIN) 600 MG tablet Take 1 tablet (600 mg total) by mouth every 8 (eight) hours as needed. Patient not taking: Reported on 07/20/2017 04/03/17   Joni ReiningSmith, Ronald K, PA-C  ibuprofen (ADVIL,MOTRIN) 800 MG tablet Take 1 tablet (800 mg total) by mouth every 8 (eight) hours as needed for moderate pain. Patient not taking: Reported on 07/20/2017 03/14/15   Joni ReiningSmith, Ronald K, PA-C  traMADol (ULTRAM) 50 MG tablet Take 1 tablet (50 mg total) by mouth every 6 (six) hours as needed. Patient not taking: Reported on 07/20/2017 04/03/17 04/03/18  Joni ReiningSmith, Ronald  K, PA-C    Allergies No known drug allergies  Family History  Problem Relation Age of Onset  . Diabetes type I Father 8640  . Hypertension Father     Social History Social History   Tobacco Use  . Smoking status: Never Smoker  . Smokeless tobacco: Never Used  Substance Use Topics  . Alcohol use: No  . Drug use: No    Review of Systems Constitutional: No fever/chills Eyes: No visual changes. ENT: No sore throat. Cardiovascular: Positive for chest pain. Respiratory: Denies shortness of breath. Gastrointestinal: No abdominal pain.  No nausea, no vomiting.  No diarrhea.  No constipation. Genitourinary: Negative for dysuria. Musculoskeletal: Negative for neck pain.  Negative for back pain. Integumentary: Negative for rash. Neurological: Negative for headaches, focal weakness or numbness.   ____________________________________________   PHYSICAL EXAM:  VITAL SIGNS: ED Triage Vitals  Enc Vitals Group     BP 09/16/17 0102 131/83     Pulse Rate 09/16/17 0102 79     Resp 09/16/17 0102 16     Temp 09/16/17 0102 98.7 F (37.1 C)     Temp Source 09/16/17 0102 Oral     SpO2 09/16/17 0102 100 %     Weight 09/16/17 0102 104.3 kg (230 lb)     Height 09/16/17 0102 1.676 m (5\' 6" )     Head Circumference --      Peak Flow --      Pain Score 09/16/17  0115 5     Pain Loc --      Pain Edu? --      Excl. in GC? --     Constitutional: Alert and oriented. Well appearing and in no acute distress. Eyes: Conjunctivae are normal.  Head: Atraumatic. Mouth/Throat: Mucous membranes are moist. Oropharynx non-erythematous. Neck: No stridor.  Cardiovascular: Normal rate, regular rhythm. Good peripheral circulation. Grossly normal heart sounds. Respiratory: Normal respiratory effort.  No retractions. Lungs CTAB. Gastrointestinal: Soft and nontender. No distention.  Musculoskeletal: No lower extremity tenderness nor edema. No gross deformities of extremities. Neurologic:  Normal speech  and language. No gross focal neurologic deficits are appreciated.  Skin:  Skin is warm, dry and intact. No rash noted. Psychiatric: Mood and affect are normal. Speech and behavior are normal.  ____________________________________________   LABS (all labs ordered are listed, but only abnormal results are displayed)  Labs Reviewed  BASIC METABOLIC PANEL - Abnormal; Notable for the following components:      Result Value   Potassium 3.3 (*)    Glucose, Bld 106 (*)    All other components within normal limits  URINALYSIS, COMPLETE (UACMP) WITH MICROSCOPIC - Abnormal; Notable for the following components:   Color, Urine YELLOW (*)    APPearance HAZY (*)    Hgb urine dipstick MODERATE (*)    Leukocytes, UA SMALL (*)    Bacteria, UA RARE (*)    Squamous Epithelial / LPF 0-5 (*)    All other components within normal limits  CBC  TROPONIN I  POC URINE PREG, ED  POCT PREGNANCY, URINE   ____________________________________________  EKG  ED ECG REPORT I, [place] Brisia Schuermann, the attending physician, personally viewed and interpreted this ECG.   Date: 09/16/2017  EKG Time: 1:08 AM  Rate: 79  Rhythm: Normal sinus rhythm  Axis: Normal  Intervals: Normal  ST&T Change: None  ____________________________________________  RADIOLOGY I, [place] Anjalee Cope, personally viewed and evaluated these images (plain radiographs) as part of my medical decision making, as well as reviewing the written report by the radiologist.  Dg Chest 2 View  Result Date: 09/16/2017 CLINICAL DATA:  31 year old female with central chest pain. EXAM: CHEST  2 VIEW COMPARISON:  None. FINDINGS: The heart size and mediastinal contours are within normal limits. Both lungs are clear. The visualized skeletal structures are unremarkable. IMPRESSION: No active cardiopulmonary disease. Electronically Signed   By: Elgie CollardArash  Radparvar M.D.   On: 09/16/2017 01:53      Procedures   ____________________________________________   INITIAL IMPRESSION / ASSESSMENT AND PLAN / ED COURSE  As part of my medical decision making, I reviewed the following data within the electronic MEDICAL RECORD [id number] year old female presented with above-stated history and physical exam secondary to chest discomfort.  EKG revealed no evidence of ischemia or infarction laboratory data including troponin negative.  Spoke with the patient at length regarding repeating troponin and obtaining a d-dimer however patient refused any blood draw.  Patient will be referred to cardiology for further outpatient evaluation and ____________________________________________  FINAL CLINICAL IMPRESSION(S) / ED DIAGNOSES  Final diagnoses:  Atypical chest pain     MEDICATIONS GIVEN DURING THIS VISIT:  Medications - No data to display   ED Discharge Orders    None       Note:  This document was prepared using Dragon voice recognition software and may include unintentional dictation errors.    Darci CurrentBrown, [place] N, MD 09/16/17 [phone]

## 2017-09-16 NOTE — ED Triage Notes (Signed)
Pt arrives ambulatory to triage with c/o chest pain. Pt is c/o central chest pain. Pt denies other cardiac symptoms and is in NAD.

## 2017-09-16 NOTE — ED Notes (Signed)
Pt refusing further blood tests at this time, Dr. Manson PasseyBrown informed

## 2017-09-19 ENCOUNTER — Telehealth: Payer: Self-pay

## 2017-09-19 NOTE — Telephone Encounter (Signed)
Lmov for patient to call back Seen in ED on 09/16/17 Will try again at a later time

## 2017-09-20 NOTE — Telephone Encounter (Signed)
Pt scheduled 10/30/16  Added to wait list  Nothing further needed.

## 2017-10-20 ENCOUNTER — Encounter: Payer: Self-pay | Admitting: Emergency Medicine

## 2017-10-20 ENCOUNTER — Emergency Department
Admission: EM | Admit: 2017-10-20 | Discharge: 2017-10-20 | Disposition: A | Discharge status: Home / Self Care | Payer: No Typology Code available for payment source | Attending: Emergency Medicine | Admitting: Emergency Medicine

## 2017-10-20 ENCOUNTER — Other Ambulatory Visit: Payer: Self-pay

## 2017-10-20 DIAGNOSIS — R10819 Abdominal tenderness, unspecified site: Secondary | ICD-10-CM | POA: Insufficient documentation

## 2017-10-20 DIAGNOSIS — Y999 Unspecified external cause status: Secondary | ICD-10-CM | POA: Insufficient documentation

## 2017-10-20 DIAGNOSIS — Y9389 Activity, other specified: Secondary | ICD-10-CM | POA: Diagnosis not present

## 2017-10-20 DIAGNOSIS — Y92410 Unspecified street and highway as the place of occurrence of the external cause: Secondary | ICD-10-CM | POA: Insufficient documentation

## 2017-10-20 MED ORDER — CYCLOBENZAPRINE HCL 5 MG PO TABS: ORAL_TABLET | ORAL | 0 refills | Status: DC

## 2017-10-20 MED ORDER — NAPROXEN 500 MG PO TABS: 500.0000 mg | ORAL_TABLET | Freq: Two times a day (BID) | ORAL | 0 refills | Status: DC
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
## 2017-10-20 NOTE — ED Notes (Signed)
Pt discharged to home.  Family member driving.  Discharge instructions reviewed.  Verbalized understanding.  No questions or concerns at this time.  Teach back verified.  Pt in NAD.  No items left in ED.   

## 2017-10-28 NOTE — Progress Notes (Unsigned)
Cardiology Office Note  Date:  10/28/2017   ID:  Beth [name], DOB 1986/01/16, MRN [id number]  PCP:  Reva Bores, MD   No chief complaint on file.   HPI:  Beth [name] is a 32 yo woman ER 09/16/2017 for chest pain, atypical  nonradiating upper bilateral chest discomfort that began before arrival.  Patient states she was having a verbal disagreement with someone at the time of onset of pain.    EKG revealed no evidence of ischemia or infarction laboratory data including troponin negative.  Spoke with the patient at length regarding repeating troponin and obtaining a d-dimer however patient refused any blood draw.    PMH:   has a past medical history of Medical history non-contributory.  PSH:    Past Surgical History:  Procedure Laterality Date  . NO PAST SURGERIES      Current Outpatient Medications  Medication Sig Dispense Refill  . amoxicillin (AMOXIL) 500 MG capsule Take 1 capsule (500 mg total) by mouth 3 (three) times daily. (Patient not taking: Reported on 07/20/2017) 30 capsule 0  . cyclobenzaprine (FLEXERIL) 5 MG tablet Take 1-2 tablets 3 times daily as needed 20 tablet 0  . ibuprofen (ADVIL,MOTRIN) 600 MG tablet Take 1 tablet (600 mg total) by mouth every 8 (eight) hours as needed. (Patient not taking: Reported on 07/20/2017) 15 tablet 0  . ibuprofen (ADVIL,MOTRIN) 800 MG tablet Take 1 tablet (800 mg total) by mouth every 8 (eight) hours as needed for moderate pain. (Patient not taking: Reported on 07/20/2017) 15 tablet 0  . naproxen (NAPROSYN) 500 MG tablet Take 1 tablet (500 mg total) by mouth 2 (two) times daily with a meal. 30 tablet 0  . SPRINTEC 28 0.25-35 MG-MCG tablet Take 1 tablet by mouth daily.    . traMADol (ULTRAM) 50 MG tablet Take 1 tablet (50 mg total) by mouth every 6 (six) hours as needed. (Patient not taking: Reported on 07/20/2017) 20 tablet 0   Current Facility-Administered Medications  Medication Dose Route Frequency Provider Last Rate Last Dose  .  medroxyPROGESTERone (DEPO-PROVERA) injection 150 mg  150 mg Intramuscular Q90 days Nicholaus Bloom C, MD   150 mg at 04/13/16 9562     Allergies:   Patient has no known allergies.   Social History:  The patient  reports that  has never smoked. she has never used smokeless tobacco. She reports that she does not drink alcohol or use drugs.   Family History:   family history includes Diabetes type I (age of onset: 61) in her father; Hypertension in her father.    Review of Systems: ROS   PHYSICAL EXAM: VS:  LMP 10/19/2017  , BMI There is no height or weight on file to calculate BMI. GEN: Well nourished, well developed, in no acute distress HEENT: normal Neck: no JVD, carotid bruits, or masses Cardiac: RRR; no murmurs, rubs, or gallops,no edema  Respiratory:  clear to auscultation bilaterally, normal work of breathing GI: soft, nontender, nondistended, + BS MS: no deformity or atrophy Skin: warm and dry, no rash Neuro:  Strength and sensation are intact Psych: euthymic mood, full affect    Recent Labs: 09/16/2017: BUN 11; Creatinine, Ser 0.68; Hemoglobin 12.1; Platelets 258; Potassium 3.3; Sodium 140    Lipid Panel No results found for: CHOL, HDL, LDLCALC, TRIG    Wt Readings from Last 3 Encounters:  10/20/17 230 lb (104.3 kg)  09/16/17 230 lb (104.3 kg)  07/20/17 236 lb 12.8 oz (107.4 kg)  ASSESSMENT AND PLAN:  No diagnosis found.   Disposition:   F/U  6 months  No orders of the defined types were placed in this encounter.    Signed, Dossie Arbourim Gollan, M.D., Ph.D. 10/28/2017  [facility], [place] [phone]

## 2017-10-29 ENCOUNTER — Telehealth: Payer: Self-pay | Admitting: Cardiovascular Disease

## 2017-10-29 NOTE — Telephone Encounter (Signed)
Left voicemail message that we have her scheduled to come in tomorrow with instructions to call back if she is unable to keep appointment.

## 2017-10-30 ENCOUNTER — Ambulatory Visit: Payer: 59 | Admitting: Cardiovascular Disease

## 2017-10-30 ENCOUNTER — Telehealth: Payer: Self-pay | Admitting: Cardiovascular Disease

## 2017-10-30 NOTE — Telephone Encounter (Signed)
Lmov for patient to call back and reschedule appointment    Will await to hear back from patient.

## 2017-11-05 NOTE — Telephone Encounter (Signed)
Lmov for patient to call back and reschedule appointment

## 2018-05-08 ENCOUNTER — Ambulatory Visit: Payer: Self-pay | Admitting: Family Medicine

## 2018-05-08 ENCOUNTER — Encounter: Payer: Self-pay | Admitting: Family Medicine

## 2018-05-08 DIAGNOSIS — Z308 Encounter for other contraceptive management: Secondary | ICD-10-CM

## 2018-05-08 NOTE — Progress Notes (Signed)
Patient did not keep appointment today. She may call to reschedule.  

## 2018-06-10 NOTE — Progress Notes (Deleted)
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

## 2018-06-11 ENCOUNTER — Encounter: Payer: Self-pay | Admitting: Family Medicine

## 2018-06-11 ENCOUNTER — Ambulatory Visit: Payer: Self-pay | Admitting: Family Medicine

## 2018-06-11 NOTE — Progress Notes (Signed)
Patient did not keep appointment today. She may call to reschedule.  

## 2018-06-13 ENCOUNTER — Encounter: Payer: Self-pay | Admitting: Radiology

## 2018-07-02 ENCOUNTER — Ambulatory Visit (INDEPENDENT_AMBULATORY_CARE_PROVIDER_SITE_OTHER): Payer: BC Managed Care – PPO | Admitting: Family Medicine

## 2018-07-02 ENCOUNTER — Encounter: Payer: Self-pay | Admitting: Family Medicine

## 2018-07-02 ENCOUNTER — Other Ambulatory Visit: Payer: Self-pay

## 2018-07-02 VITALS — BP 126/80 | HR 72 | Ht 66.0 in | Wt 237.0 lb

## 2018-07-02 DIAGNOSIS — Z01419 Encounter for gynecological examination (general) (routine) without abnormal findings: Secondary | ICD-10-CM

## 2018-07-02 DIAGNOSIS — Z23 Encounter for immunization: Secondary | ICD-10-CM

## 2018-07-02 DIAGNOSIS — Z30430 Encounter for insertion of intrauterine contraceptive device: Secondary | ICD-10-CM | POA: Diagnosis not present

## 2018-07-02 DIAGNOSIS — Z1151 Encounter for screening for human papillomavirus (HPV): Secondary | ICD-10-CM

## 2018-07-02 DIAGNOSIS — Z3202 Encounter for pregnancy test, result negative: Secondary | ICD-10-CM

## 2018-07-02 DIAGNOSIS — Z124 Encounter for screening for malignant neoplasm of cervix: Secondary | ICD-10-CM

## 2018-07-02 LAB — POCT URINE PREGNANCY: Preg Test, Ur: NEGATIVE

## 2018-07-02 MED ORDER — LEVONORGESTREL 20 MCG/24HR IU IUD
INTRAUTERINE_SYSTEM | Freq: Once | INTRAUTERINE | Status: AC
  Administered 2018-07-02: 10:00:00 via INTRAUTERINE

## 2018-07-02 NOTE — Progress Notes (Signed)
  Subjective:     [place] Tarnow is a 32 y.o. female and is here for a comprehensive physical exam. The patient reports no problemsConsidering Mirena. Forgets to take her OC's. Has not had any unprotected intercourse since LMP which was 1.5 wks ago.  The following portions of the patient's history were reviewed and updated as appropriate: allergies, current medications, past family history, past medical history, past social history, past surgical history and problem list.  Review of Systems Pertinent items noted in HPI and remainder of comprehensive ROS otherwise negative.   Objective:    BP 126/80 (BP Location: Left Arm)   Pulse 72   Ht 5\' 6"  (1.676 m)   Wt 237 lb (107.5 kg)   LMP 06/24/2018 (Exact Date)   BMI 38.25 kg/m  General appearance: alert, cooperative, appears stated age and moderately obese Head: Normocephalic, without obvious abnormality, atraumatic Neck: no adenopathy, supple, symmetrical, trachea midline and thyroid not enlarged, symmetric, no tenderness/mass/nodules Lungs: clear to auscultation bilaterally Breasts: normal appearance, no masses or tenderness Heart: regular rate and rhythm, S1, S2 normal, no murmur, click, rub or gallop Abdomen: soft, non-tender; bowel sounds normal; no masses,  no organomegaly Pelvic: cervix normal in appearance, external genitalia normal, no adnexal masses or tenderness, no cervical motion tenderness, uterus normal size, shape, and consistency and vagina normal without discharge Extremities: extremities normal, atraumatic, no cyanosis or edema Pulses: 2+ and symmetric Skin: Skin color, texture, turgor normal. No rashes or lesions Lymph nodes: Cervical, supraclavicular, and axillary nodes normal. Neurologic: Grossly normal   Patient identified, informed consent performed, signed copy in chart, time out was performed.  Urine pregnancy test negative.  Procedure:  Speculum placed in the vagina.  Cervix visualized.  Cleaned with Betadine  x 2.  Grasped anteriourly with a single tooth tenaculum.  Uterus sounded to 9 cm.  Mirena IUD placed per manufacturer's recommendations.  Strings trimmed to 3 cm.   Patient given post procedure instructions and Mirena care card with expiration date.  Patient is asked to check IUD strings periodically and follow up in 4-6 weeks for IUD check.  Assessment:    Healthy female exam.      Plan:      Problem List Items Addressed This Visit    None    Visit Diagnoses    Encounter for gynecological examination without abnormal finding    -  Primary   Relevant Orders   POCT urine pregnancy (Completed)   Cytology - PAP   Screening for malignant neoplasm of cervix       Need for immunization against influenza       Relevant Orders   Flu Vaccine QUAD 36+ mos IM (Completed)   Encounter for insertion of mirena IUD          See After Visit Summary for Counseling Recommendations

## 2018-07-02 NOTE — Progress Notes (Signed)
Last pap 07/20/2017- HPV

## 2018-07-02 NOTE — Patient Instructions (Signed)
Preventive Care 18-39 Years, Female Preventive care refers to lifestyle choices and visits with your health care provider that can promote health and wellness. What does preventive care include?  A yearly physical exam. This is also called an annual well check.  Dental exams once or twice a year.  Routine eye exams. Ask your health care provider how often you should have your eyes checked.  Personal lifestyle choices, including: ? Daily care of your teeth and gums. ? Regular physical activity. ? Eating a healthy diet. ? Avoiding tobacco and drug use. ? Limiting alcohol use. ? Practicing safe sex. ? Taking vitamin and mineral supplements as recommended by your health care provider. What happens during an annual well check? The services and screenings done by your health care provider during your annual well check will depend on your age, overall health, lifestyle risk factors, and family history of disease. Counseling Your health care provider may ask you questions about your:  Alcohol use.  Tobacco use.  Drug use.  Emotional well-being.  Home and relationship well-being.  Sexual activity.  Eating habits.  Work and work Statistician.  Method of birth control.  Menstrual cycle.  Pregnancy history.  Screening You may have the following tests or measurements:  Height, weight, and BMI.  Diabetes screening. This is done by checking your blood sugar (glucose) after you have not eaten for a while (fasting).  Blood pressure.  Lipid and cholesterol levels. These may be checked every 5 years starting at age 38.  Skin check.  Hepatitis C blood test.  Hepatitis B blood test.  Sexually transmitted disease (STD) testing.  BRCA-related cancer screening. This may be done if you have a family history of breast, ovarian, tubal, or peritoneal cancers.  Pelvic exam and Pap test. This may be done every 3 years starting at age 38. Starting at age 30, this may be done  every 5 years if you have a Pap test in combination with an HPV test.  Discuss your test results, treatment options, and if necessary, the need for more tests with your health care provider. Vaccines Your health care provider may recommend certain vaccines, such as:  Influenza vaccine. This is recommended every year.  Tetanus, diphtheria, and acellular pertussis (Tdap, Td) vaccine. You may need a Td booster every 10 years.  Varicella vaccine. You may need this if you have not been vaccinated.  HPV vaccine. If you are 39 or younger, you may need three doses over 6 months.  Measles, mumps, and rubella (MMR) vaccine. You may need at least one dose of MMR. You may also need a second dose.  Pneumococcal 13-valent conjugate (PCV13) vaccine. You may need this if you have certain conditions and were not previously vaccinated.  Pneumococcal polysaccharide (PPSV23) vaccine. You may need one or two doses if you smoke cigarettes or if you have certain conditions.  Meningococcal vaccine. One dose is recommended if you are age 68-21 years and a first-year college student living in a residence hall, or if you have one of several medical conditions. You may also need additional booster doses.  Hepatitis A vaccine. You may need this if you have certain conditions or if you travel or work in places where you may be exposed to hepatitis A.  Hepatitis B vaccine. You may need this if you have certain conditions or if you travel or work in places where you may be exposed to hepatitis B.  Haemophilus influenzae type b (Hib) vaccine. You may need this  if you have certain risk factors.  Talk to your health care provider about which screenings and vaccines you need and how often you need them. This information is not intended to replace advice given to you by your health care provider. Make sure you discuss any questions you have with your health care provider. Document Released: 10/31/2001 Document Revised:  05/24/2016 Document Reviewed: 07/06/2015 Elsevier Interactive Patient Education  [employer].

## 2018-07-02 NOTE — Progress Notes (Signed)
Would like to discuss the mirena for birth control, just finished her last pill pack.   Scheryl Marten RN

## 2018-07-04 LAB — CYTOLOGY - PAP
Diagnosis: NEGATIVE
HPV: NOT DETECTED

## 2018-07-08 ENCOUNTER — Encounter (HOSPITAL_COMMUNITY): Payer: Self-pay | Admitting: Emergency Medicine

## 2018-07-08 ENCOUNTER — Emergency Department (HOSPITAL_COMMUNITY)
Admission: EM | Admit: 2018-07-08 | Discharge: 2018-07-08 | Disposition: A | Discharge status: Home / Self Care | Payer: BC Managed Care – PPO | Attending: Emergency Medicine | Admitting: Emergency Medicine

## 2018-07-08 ENCOUNTER — Other Ambulatory Visit: Payer: Self-pay

## 2018-07-08 ENCOUNTER — Emergency Department (HOSPITAL_COMMUNITY): Payer: BC Managed Care – PPO

## 2018-07-08 DIAGNOSIS — Y92410 Unspecified street and highway as the place of occurrence of the external cause: Secondary | ICD-10-CM | POA: Insufficient documentation

## 2018-07-08 DIAGNOSIS — Y998 Other external cause status: Secondary | ICD-10-CM | POA: Diagnosis not present

## 2018-07-08 DIAGNOSIS — Y939 Activity, unspecified: Secondary | ICD-10-CM | POA: Insufficient documentation

## 2018-07-08 DIAGNOSIS — R079 Chest pain, unspecified: Secondary | ICD-10-CM | POA: Diagnosis present

## 2018-07-08 DIAGNOSIS — Z5321 Procedure and treatment not carried out due to patient leaving prior to being seen by health care provider: Secondary | ICD-10-CM | POA: Insufficient documentation

## 2018-07-08 LAB — I-STAT BETA HCG BLOOD, ED (MC, WL, AP ONLY): I-stat hCG, quantitative: <5 m[IU]/mL (ref <5)

## 2018-07-08 LAB — CBC
HEMATOCRIT: 41.1 % (ref 36.0–46.0)
HEMOGLOBIN: 12.6 g/dL (ref 12.0–15.0)
MCH: 28.6 pg (ref 26.0–34.0)
MCHC: 30.7 g/dL (ref 30.0–36.0)
MCV: 93.4 fL (ref 80.0–100.0)
PLATELETS: 258 10*3/uL (ref 150–400)
RBC: 4.4 MIL/uL (ref 3.87–5.11)
RDW: 12.3 % (ref 11.5–15.5)
WBC: 9.1 10*3/uL (ref 4.0–10.5)
nRBC: 0 % (ref 0.0–0.2)

## 2018-07-08 LAB — BASIC METABOLIC PANEL
Anion gap: 10 (ref 5–15)
BUN: 11 mg/dL (ref 6–20)
CHLORIDE: 102 mmol/L (ref 98–111)
CO2: 24 mmol/L (ref 22–32)
Calcium: 9.4 mg/dL (ref 8.9–10.3)
Creatinine, Ser: 0.8 mg/dL (ref 0.44–1.00)
GFR calc Af Amer: >60 mL/min (ref >60)
GFR calc non Af Amer: >60 mL/min (ref >60)
GLUCOSE: 107 mg/dL — AB (ref 70–99)
POTASSIUM: 3.7 mmol/L (ref 3.5–5.1)
Sodium: 136 mmol/L (ref 135–145)

## 2018-07-08 LAB — I-STAT TROPONIN, ED: TROPONIN I, POC: 0.02 ng/mL (ref 0.00–0.08)

## 2018-07-08 NOTE — ED Notes (Addendum)
Advised patient to stay, but patient decided to leave at this time.
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
## 2018-07-09 ENCOUNTER — Other Ambulatory Visit: Payer: Self-pay

## 2018-07-09 ENCOUNTER — Ambulatory Visit
Admission: EM | Admit: 2018-07-09 | Discharge: 2018-07-09 | Disposition: A | Discharge status: Home / Self Care | Payer: No Typology Code available for payment source | Attending: Family Medicine | Admitting: Family Medicine

## 2018-07-09 ENCOUNTER — Ambulatory Visit (INDEPENDENT_AMBULATORY_CARE_PROVIDER_SITE_OTHER): Payer: No Typology Code available for payment source

## 2018-07-09 ENCOUNTER — Encounter: Payer: Self-pay | Admitting: Emergency Medicine

## 2018-07-09 ENCOUNTER — Ambulatory Visit: Payer: Self-pay | Admitting: Obstetrics & Gynecology

## 2018-07-09 DIAGNOSIS — M542 Cervicalgia: Secondary | ICD-10-CM

## 2018-07-09 DIAGNOSIS — T07XXXA Unspecified multiple injuries, initial encounter: Secondary | ICD-10-CM

## 2018-07-09 DIAGNOSIS — Z30431 Encounter for routine checking of intrauterine contraceptive device: Secondary | ICD-10-CM

## 2018-07-09 DIAGNOSIS — S161XXA Strain of muscle, fascia and tendon at neck level, initial encounter: Secondary | ICD-10-CM

## 2018-07-09 MED ORDER — METAXALONE 800 MG PO TABS: 800.0000 mg | ORAL_TABLET | Freq: Three times a day (TID) | ORAL | 0 refills | Status: DC

## 2018-07-09 MED ORDER — MELOXICAM 15 MG PO TABS: 15.0000 mg | ORAL_TABLET | Freq: Every day | ORAL | 0 refills | Status: DC

## 2018-07-09 NOTE — Discharge Instructions (Addendum)
Apply ice 20 minutes out of every 2 hours 4-5 times daily for comfort. Use  Caution while taking muscle relaxers.  Do not perform activities requiring concentration or judgment and do not drive.  °

## 2018-07-09 NOTE — Progress Notes (Deleted)
   Patient did not show up today for her scheduled appointment.   Synia Douglass, MD, FACOG Obstetrician & Gynecologist, [facility], [facility]  
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
## 2018-07-11 ENCOUNTER — Encounter: Payer: Self-pay | Admitting: Emergency Medicine

## 2018-07-11 ENCOUNTER — Other Ambulatory Visit: Payer: Self-pay

## 2018-07-11 ENCOUNTER — Ambulatory Visit
Admission: EM | Admit: 2018-07-11 | Discharge: 2018-07-11 | Disposition: A | Discharge status: Home / Self Care | Payer: BC Managed Care – PPO | Attending: Physician Assistant | Admitting: Physician Assistant

## 2018-07-11 DIAGNOSIS — R0789 Other chest pain: Secondary | ICD-10-CM

## 2018-07-11 DIAGNOSIS — M545 Low back pain, unspecified: Secondary | ICD-10-CM

## 2018-07-11 DIAGNOSIS — M62838 Other muscle spasm: Secondary | ICD-10-CM | POA: Diagnosis not present

## 2018-07-11 DIAGNOSIS — S2020XD Contusion of thorax, unspecified, subsequent encounter: Secondary | ICD-10-CM | POA: Diagnosis not present

## 2018-07-11 MED ORDER — TRAMADOL HCL 50 MG PO TABS: ORAL_TABLET | ORAL | 0 refills | Status: DC
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
## 2018-07-11 NOTE — Discharge Instructions (Signed)
CHEST WALL PAIN/CONTUSIONS: You have  had normal x-rays of the chest. Your lungs are clear and heart is beating regularly. You pain is likely due to contusions/muscle spasms and strain of the chest. Continue Mobic and Skelaxin as prescribed and begin Ultram prn for additional pain relief. Apply ice to area. This should improve and resolve over the next week. If it does not go to ER for possible CT scan.   CHEST PAIN RED FLAGS: The main red flags to look out for that could mean a heart attack are; pain in the center of the chest, difficulty breathing, nausea, sweating, clammy skin, dizziness, pain radiating to the jaw/neck/arm, racing heart, etc. If any of those symptoms are present, you must immediately get to the ER.   BACK PAIN: Stressed avoiding painful activities . PRICE guidelines reviewed. May alternate ice and heat. Consider use of muscle rubs, Salonpas patches, etc. Use medications as directed including muscle relaxers. Take anti-inflammatory medications as prescribed. F/u with PCP in 7-10 days for reexamination, and please feel free to call or return to the urgent care at any time for any questions or concerns you may have and we will be happy to help you!   BACK PAIN RED FLAGS: If the back pain acutely worsens or there are any red flag symptoms such as numbness/tingling, leg weakness, saddle anesthesia, or loss of bowel/bladder control, go immediately to the ER. Follow up with [place] as scheduled or sooner if the pain does not begin to resolve or if it worsens before the follow up

## 2018-07-26 ENCOUNTER — Other Ambulatory Visit: Payer: Self-pay | Admitting: Chiropractor

## 2018-07-26 ENCOUNTER — Ambulatory Visit
Admission: RE | Admit: 2018-07-26 | Discharge: 2018-07-26 | Disposition: A | Discharge status: Home / Self Care | Payer: BC Managed Care – PPO | Source: Ambulatory Visit | Attending: Chiropractor | Admitting: Chiropractor

## 2018-07-26 DIAGNOSIS — R079 Chest pain, unspecified: Secondary | ICD-10-CM

## 2018-07-30 ENCOUNTER — Ambulatory Visit: Payer: BC Managed Care – PPO | Admitting: Family Medicine

## 2018-07-30 ENCOUNTER — Encounter: Payer: Self-pay | Admitting: Advanced Practice Midwife

## 2018-07-30 DIAGNOSIS — Z975 Presence of (intrauterine) contraceptive device: Secondary | ICD-10-CM | POA: Insufficient documentation

## 2018-07-31 ENCOUNTER — Ambulatory Visit: Payer: BC Managed Care – PPO | Admitting: Advanced Practice Midwife

## 2018-08-01 ENCOUNTER — Encounter: Payer: Self-pay | Admitting: Obstetrics & Gynecology

## 2018-08-01 ENCOUNTER — Ambulatory Visit (INDEPENDENT_AMBULATORY_CARE_PROVIDER_SITE_OTHER): Payer: BC Managed Care – PPO | Admitting: Obstetrics & Gynecology

## 2018-08-01 VITALS — BP 113/74 | HR 76 | Ht 66.0 in | Wt 244.0 lb

## 2018-08-01 DIAGNOSIS — Z30431 Encounter for routine checking of intrauterine contraceptive device: Secondary | ICD-10-CM | POA: Diagnosis not present

## 2018-08-01 NOTE — Progress Notes (Signed)
   GYNECOLOGY OFFICE PROGRESS NOTE  History:  32 y.o. [id number] here today for today for IUD string check; Mirena IUD was placed  07/02/2018. No complaints about the IUD, no concerning side effects.  The following portions of the patient's history were reviewed and updated as appropriate: allergies, current medications, past family history, past medical history, past social history, past surgical history and problem list. Last pap smear on 07/02/2018 was normal, negative HRHPV.  Review of Systems:  Pertinent items are noted in HPI.   Objective:  Physical Exam Blood pressure 113/74, pulse 76, height 5\' 6"  (1.676 m), weight 244 lb (110.7 kg). CONSTITUTIONAL: Well-developed, well-nourished female in no acute distress.  HENT:  Normocephalic, atraumatic. External right and left ear normal. Oropharynx is clear and moist EYES: Conjunctivae and EOM are normal. Pupils are equal, round, and reactive to light. No scleral icterus.  NECK: Normal range of motion, supple, no masses CARDIOVASCULAR: Normal heart rate noted RESPIRATORY: Effort and breath sounds normal, no problems with respiration noted ABDOMEN: Soft, no distention noted.   PELVIC: Normal appearing external genitalia; normal appearing vaginal mucosa and cervix.  IUD strings visualized, about 3 cm in length outside cervix.   Assessment & Plan:  Normal IUD check. Patient to keep IUD in place for seven years; can come in for removal if she desires pregnancy earlier or for any concerning symptoms. Routine preventative health maintenance measures emphasized.   Jaynie CollinsUGONNA  Tandy Grawe, MD, FACOG Obstetrician & Gynecologist, [facility], [facility]

## 2018-09-12 ENCOUNTER — Telehealth: Payer: Self-pay | Admitting: *Deleted

## 2018-09-12 NOTE — Telephone Encounter (Signed)
Returned patients call, pt asked if it was normal to still get a period with her IUD. Explained that it is normal to continue to have periods with an IUD and that some people never stop getting periods and hers may eventually get lighter or go away. Pt denies any other complaints or issues with IUD.  Scheryl Martenhristine Soliz, RN

## 2018-09-12 NOTE — Telephone Encounter (Signed)
-----   Message from Heather B Walker, NT sent at 09/12/2018  2:20 PM EST ----- Regarding: call patient Patient is requesting call back from nurse.   

## 2018-09-12 NOTE — Telephone Encounter (Signed)
Pt called requesting for nurse to call her, attempted to call, left message.

## 2018-09-12 NOTE — Telephone Encounter (Signed)
-----   Message from Rozann LeschesHeather B Walker, NT sent at 09/12/2018  2:20 PM EST ----- Regarding: call patient Patient is requesting call back from nurse.

## 2018-11-06 ENCOUNTER — Ambulatory Visit (INDEPENDENT_AMBULATORY_CARE_PROVIDER_SITE_OTHER): Payer: BC Managed Care – PPO

## 2018-11-06 VITALS — BP 111/75 | HR 72

## 2018-11-06 DIAGNOSIS — B373 Candidiasis of vulva and vagina: Secondary | ICD-10-CM

## 2018-11-06 DIAGNOSIS — N760 Acute vaginitis: Secondary | ICD-10-CM

## 2018-11-06 DIAGNOSIS — N898 Other specified noninflammatory disorders of vagina: Secondary | ICD-10-CM | POA: Diagnosis not present

## 2018-11-06 NOTE — Progress Notes (Signed)
I have reviewed the chart and agree with nursing staff's documentation of this patient's encounter.  Jaynie Collins, MD 11/06/2018 8:53 AM

## 2018-11-06 NOTE — Progress Notes (Signed)
SUBJECTIVE:  33 y.o. female complains of vaginal  discharge for a couple of days. She reports being Amoxicillin for a tooth infection. Denies abnormal vaginal bleeding or significant pelvic pain or fever. No UTI symptoms. Denies history of known exposure to STD.  No LMP recorded. (Menstrual status: IUD).  OBJECTIVE:  She appears well, afebrile. Urine dipstick: not completed  ASSESSMENT: Vaginal Discharge small amount  Vaginal Odor: small amount    PLAN:  GC, chlamydia, trichomonas, BVAG, CVAG probe sent to lab. Treatment: To be determined once lab results are received ROV prn if symptoms persist or worsen. She requested to have a diflucan called into her pharmacy.

## 2018-11-07 LAB — CERVICOVAGINAL ANCILLARY ONLY
Bacterial vaginitis: NEGATIVE
CANDIDA VAGINITIS: POSITIVE — AB

## 2018-11-08 ENCOUNTER — Telehealth: Payer: Self-pay | Admitting: *Deleted

## 2018-11-08 MED ORDER — FLUCONAZOLE 150 MG PO TABS: 150.0000 mg | ORAL_TABLET | Freq: Once | ORAL | 1 refills | Status: AC

## 2018-11-08 NOTE — Telephone Encounter (Signed)
Pt called to get results, informed of results medication sent in.

## 2018-12-25 ENCOUNTER — Ambulatory Visit (INDEPENDENT_AMBULATORY_CARE_PROVIDER_SITE_OTHER): Payer: BC Managed Care – PPO | Admitting: Advanced Practice Midwife

## 2018-12-25 ENCOUNTER — Other Ambulatory Visit: Payer: Self-pay

## 2018-12-25 ENCOUNTER — Encounter: Payer: Self-pay | Admitting: Advanced Practice Midwife

## 2018-12-25 VITALS — BP 112/76 | HR 69 | Wt 273.0 lb

## 2018-12-25 DIAGNOSIS — Z30431 Encounter for routine checking of intrauterine contraceptive device: Secondary | ICD-10-CM | POA: Diagnosis not present

## 2018-12-25 MED ORDER — FLUCONAZOLE 150 MG PO TABS: 150.0000 mg | ORAL_TABLET | Freq: Once | ORAL | 0 refills | Status: AC

## 2018-12-25 NOTE — Patient Instructions (Signed)
Preventive Care 18-39 Years, Female Preventive care refers to lifestyle choices and visits with your health care provider that can promote health and wellness. What does preventive care include?   A yearly physical exam. This is also called an annual well check.  Dental exams once or twice a year.  Routine eye exams. Ask your health care provider how often you should have your eyes checked.  Personal lifestyle choices, including: ? Daily care of your teeth and gums. ? Regular physical activity. ? Eating a healthy diet. ? Avoiding tobacco and drug use. ? Limiting alcohol use. ? Practicing safe sex. ? Taking vitamin and mineral supplements as recommended by your health care provider. What happens during an annual well check? The services and screenings done by your health care provider during your annual well check will depend on your age, overall health, lifestyle risk factors, and family history of disease. Counseling Your health care provider may ask you questions about your:  Alcohol use.  Tobacco use.  Drug use.  Emotional well-being.  Home and relationship well-being.  Sexual activity.  Eating habits.  Work and work Statistician.  Method of birth control.  Menstrual cycle.  Pregnancy history. Screening You may have the following tests or measurements:  Height, weight, and BMI.  Diabetes screening. This is done by checking your blood sugar (glucose) after you have not eaten for a while (fasting).  Blood pressure.  Lipid and cholesterol levels. These may be checked every 5 years starting at age 73.  Skin check.  Hepatitis C blood test.  Hepatitis B blood test.  Sexually transmitted disease (STD) testing.  BRCA-related cancer screening. This may be done if you have a family history of breast, ovarian, tubal, or peritoneal cancers.  Pelvic exam and Pap test. This may be done every 3 years starting at age 31. Starting at age 67, this may be done every 5  years if you have a Pap test in combination with an HPV test. Discuss your test results, treatment options, and if necessary, the need for more tests with your health care provider. Vaccines Your health care provider may recommend certain vaccines, such as:  Influenza vaccine. This is recommended every year.  Tetanus, diphtheria, and acellular pertussis (Tdap, Td) vaccine. You may need a Td booster every 10 years.  Varicella vaccine. You may need this if you have not been vaccinated.  HPV vaccine. If you are 42 or younger, you may need three doses over 6 months.  Measles, mumps, and rubella (MMR) vaccine. You may need at least one dose of MMR. You may also need a second dose.  Pneumococcal 13-valent conjugate (PCV13) vaccine. You may need this if you have certain conditions and were not previously vaccinated.  Pneumococcal polysaccharide (PPSV23) vaccine. You may need one or two doses if you smoke cigarettes or if you have certain conditions.  Meningococcal vaccine. One dose is recommended if you are age 55-21 years and a first-year college student living in a residence hall, or if you have one of several medical conditions. You may also need additional booster doses.  Hepatitis A vaccine. You may need this if you have certain conditions or if you travel or work in places where you may be exposed to hepatitis A.  Hepatitis B vaccine. You may need this if you have certain conditions or if you travel or work in places where you may be exposed to hepatitis B.  Haemophilus influenzae type b (Hib) vaccine. You may need this if you  have certain risk factors. Talk to your health care provider about which screenings and vaccines you need and how often you need them. This information is not intended to replace advice given to you by your health care provider. Make sure you discuss any questions you have with your health care provider. Document Released: 10/31/2001 Document Revised: 04/17/2017  Document Reviewed: 07/06/2015 Elsevier Interactive Patient Education  2019 [employer].

## 2018-12-25 NOTE — Progress Notes (Signed)
    GYNECOLOGY OFFICE ENCOUNTER NOTE  History:  33 y.o. [id number] here today for today for IUD string check; Mirena  IUD was placed  October 2019. Patient reports complaints of irregular vaginal spotting and her husband has communicated that the strings are "poking him" during intercourse.  The following portions of the patient's history were reviewed and updated as appropriate: allergies, current medications, past family history, past medical history, past social history, past surgical history and problem list. Last pap smear on 07/02/2018 was normal, negative HRHPV.  Review of Systems:  Pertinent items are noted in HPI.   Objective:  Physical Exam Blood pressure 112/76, pulse 69, weight 273 lb (123.8 kg). CONSTITUTIONAL: Well-developed, well-nourished female in no acute distress.  HENT:  Normocephalic, atraumatic. External right and left ear normal. Oropharynx is clear and moist EYES: Conjunctivae and EOM are normal. Pupils are equal, round, and reactive to light. No scleral icterus.  NECK: Normal range of motion, supple, no masses CARDIOVASCULAR: Normal heart rate noted RESPIRATORY: Effort and breath sounds normal, no problems with respiration noted ABDOMEN: Soft, no distention noted.   PELVIC: Normal appearing external genitalia; normal appearing vaginal mucosa and cervix.  IUD strings visualized, about 3cm in length outside cervix.   Assessment & Plan:  Cofirmed with patient that IUD is appropriately positioned and strings are appropriate length. Strings noted to be protruding into vaginal vault. Discussed that strings could be trimmed but that would make it more likely for partner to experience sharp contact and potentially make it more difficult to position strings behind cervical os.  With patient's agreement, strings not trimmed. Fox swab used to position strings closer to and slightly behind cervical os. Patient made aware her partner will likely still be able to detect strings but  should not be a sharp sensation.  Clayton Bibles, CNM [employer] [facility] [employer], [facility]

## 2018-12-25 NOTE — Progress Notes (Signed)
partner can feel strings, and pt thinks she may have a yeast infection

## 2019-03-13 ENCOUNTER — Ambulatory Visit (INDEPENDENT_AMBULATORY_CARE_PROVIDER_SITE_OTHER): Payer: BC Managed Care – PPO | Admitting: *Deleted

## 2019-03-13 ENCOUNTER — Other Ambulatory Visit: Payer: Self-pay

## 2019-03-13 VITALS — BP 112/68 | HR 68

## 2019-03-13 DIAGNOSIS — B9689 Other specified bacterial agents as the cause of diseases classified elsewhere: Secondary | ICD-10-CM

## 2019-03-13 DIAGNOSIS — N898 Other specified noninflammatory disorders of vagina: Secondary | ICD-10-CM | POA: Diagnosis not present

## 2019-03-13 DIAGNOSIS — N760 Acute vaginitis: Secondary | ICD-10-CM

## 2019-03-13 NOTE — Progress Notes (Signed)
SUBJECTIVE:  33 y.o. female complains of white vaginal discharge for 1 week. Denies abnormal vaginal bleeding or significant pelvic pain or fever. No UTI symptoms. Denies history of known exposure to STD.  No LMP recorded. (Menstrual status: IUD).  OBJECTIVE:  She appears well, afebrile.   ASSESSMENT:  Vaginal Discharge     PLAN:  BVAG, CVAG probe sent to lab. Treatment: To be determined once lab results are received ROV prn if symptoms persist or worsen.

## 2019-03-14 LAB — CERVICOVAGINAL ANCILLARY ONLY
Bacterial vaginitis: POSITIVE — AB
Candida vaginitis: NEGATIVE

## 2019-03-14 NOTE — Progress Notes (Signed)
I have reviewed the chart and agree with nursing staff's documentation of this patient's encounter.  Aletha Halim, MD 03/14/2019 8:54 PM

## 2019-03-15 MED ORDER — METRONIDAZOLE 500 MG PO TABS: 500.0000 mg | ORAL_TABLET | Freq: Two times a day (BID) | ORAL | 1 refills | Status: DC

## 2019-03-15 NOTE — Addendum Note (Signed)
Addended by: Aletha Halim on: 03/15/2019 09:55 AM   Modules accepted: Orders

## 2019-03-17 ENCOUNTER — Other Ambulatory Visit: Payer: Self-pay | Admitting: *Deleted

## 2019-03-17 MED ORDER — METRONIDAZOLE 500 MG PO TABS: 500.0000 mg | ORAL_TABLET | Freq: Two times a day (BID) | ORAL | 1 refills | Status: DC

## 2019-03-17 NOTE — Telephone Encounter (Signed)
Pt called and RX was sent to wrong pharmacy, pt would like flagyl sent to [facility] pharmacy.

## 2019-04-29 ENCOUNTER — Encounter: Payer: Self-pay | Admitting: Radiology

## 2019-06-02 ENCOUNTER — Ambulatory Visit: Payer: BC Managed Care – PPO

## 2019-07-16 ENCOUNTER — Other Ambulatory Visit: Payer: Self-pay

## 2019-07-16 ENCOUNTER — Ambulatory Visit (INDEPENDENT_AMBULATORY_CARE_PROVIDER_SITE_OTHER): Payer: BC Managed Care – PPO

## 2019-07-16 VITALS — BP 129/73 | HR 72

## 2019-07-16 DIAGNOSIS — Z23 Encounter for immunization: Secondary | ICD-10-CM | POA: Diagnosis not present

## 2019-07-16 NOTE — Progress Notes (Signed)
Patient presented to the office today for her flu injection.  Given by: D.Graham rt deltoid muscle. [place] #[id number] Side Effects: none at this time.  Advised patient her arm my be sore for a couple of days.

## 2019-07-16 NOTE — Progress Notes (Signed)
Patient seen and assessed by nursing staff during this encounter. I have reviewed the chart and agree with the documentation and plan.  Verita Schneiders, MD 07/16/2019 1:30 PM

## 2019-08-13 ENCOUNTER — Ambulatory Visit: Payer: BC Managed Care – PPO

## 2019-12-22 ENCOUNTER — Ambulatory Visit: Payer: BC Managed Care – PPO | Admitting: Obstetrics & Gynecology

## 2019-12-22 DIAGNOSIS — Z01419 Encounter for gynecological examination (general) (routine) without abnormal findings: Secondary | ICD-10-CM

## 2020-03-09 ENCOUNTER — Ambulatory Visit: Payer: 59 | Admitting: Obstetrics & Gynecology

## 2020-09-07 ENCOUNTER — Other Ambulatory Visit (HOSPITAL_COMMUNITY)
Admission: RE | Admit: 2020-09-07 | Discharge: 2020-09-07 | Disposition: A | Discharge status: Home / Self Care | Payer: 59 | Source: Ambulatory Visit | Attending: Obstetrics & Gynecology | Admitting: Obstetrics & Gynecology

## 2020-09-07 ENCOUNTER — Ambulatory Visit (INDEPENDENT_AMBULATORY_CARE_PROVIDER_SITE_OTHER): Payer: 59 | Admitting: Obstetrics & Gynecology

## 2020-09-07 ENCOUNTER — Other Ambulatory Visit: Payer: Self-pay

## 2020-09-07 ENCOUNTER — Encounter: Payer: Self-pay | Admitting: Obstetrics & Gynecology

## 2020-09-07 VITALS — BP 126/78 | HR 74 | Ht 66.0 in | Wt 284.0 lb

## 2020-09-07 DIAGNOSIS — N898 Other specified noninflammatory disorders of vagina: Secondary | ICD-10-CM | POA: Insufficient documentation

## 2020-09-07 DIAGNOSIS — Z01419 Encounter for gynecological examination (general) (routine) without abnormal findings: Secondary | ICD-10-CM | POA: Insufficient documentation

## 2020-09-07 DIAGNOSIS — B373 Candidiasis of vulva and vagina: Secondary | ICD-10-CM | POA: Diagnosis not present

## 2020-09-07 DIAGNOSIS — Z30432 Encounter for removal of intrauterine contraceptive device: Secondary | ICD-10-CM

## 2020-09-07 DIAGNOSIS — B3731 Acute candidiasis of vulva and vagina: Secondary | ICD-10-CM

## 2020-09-07 NOTE — Patient Instructions (Addendum)
 Vaginitis Vaginitis is a condition in which the vaginal tissue swells and becomes red (inflamed). This condition is most often caused by a change in the normal balance of bacteria and yeast that live in the vagina. This change causes an overgrowth of certain bacteria or yeast, which causes the inflammation. There are different types of vaginitis, but the most common types are:  Bacterial vaginosis.  Yeast infection (candidiasis).  Trichomoniasis vaginitis. This is a sexually transmitted disease (STD).  Viral vaginitis.  Atrophic vaginitis.  Allergic vaginitis. What are the causes? The cause of this condition depends on the type of vaginitis. It can be caused by:  Bacteria (bacterial vaginosis).  Yeast, which is a fungus (yeast infection).  A parasite (trichomoniasis vaginitis).  A virus (viral vaginitis).  Low hormone levels (atrophic vaginitis). Low hormone levels can occur during pregnancy, breastfeeding, or after menopause.  Irritants, such as bubble baths, scented tampons, and feminine sprays (allergic vaginitis). Other factors can change the normal balance of the yeast and bacteria that live in the vagina. These include:  Antibiotic medicines.  Poor hygiene.  Diaphragms, vaginal sponges, spermicides, birth control pills, and intrauterine devices (IUD).  Sex.  Infection.  Uncontrolled diabetes.  A weakened defense (immune) system. What increases the risk? This condition is more likely to develop in women who:  Smoke.  Use vaginal douches, scented tampons, or scented sanitary pads.  Wear tight-fitting pants.  Wear thong underwear.  Use oral birth control pills or an IUD.  Have sex without a condom.  Have multiple sex partners.  Have an STD.  Frequently use the spermicide nonoxynol-9.  Eat lots of foods high in sugar.  Have uncontrolled diabetes.  Have low estrogen levels.  Have a weakened immune system from an immune disorder or medical  treatment.  Are pregnant or breastfeeding. What are the signs or symptoms? Symptoms vary depending on the cause of the vaginitis. Common symptoms include:  Abnormal vaginal discharge. ? The discharge is white, gray, or yellow with bacterial vaginosis. ? The discharge is thick, white, and cheesy with a yeast infection. ? The discharge is frothy and yellow or greenish with trichomoniasis.  A bad vaginal smell. The smell is fishy with bacterial vaginosis.  Vaginal itching, pain, or swelling.  Sex that is painful.  Pain or burning when urinating. Sometimes there are no symptoms. How is this diagnosed? This condition is diagnosed based on your symptoms and medical history. A physical exam, including a pelvic exam, will also be done. You may also have other tests, including:  Tests to determine the pH level (acidity or alkalinity) of your vagina.  A whiff test, to assess the odor that results when a sample of your vaginal discharge is mixed with a potassium hydroxide solution.  Tests of vaginal fluid. A sample will be examined under a microscope. How is this treated? Treatment varies depending on the type of vaginitis you have. Your treatment may include:  Antibiotic creams or pills to treat bacterial vaginosis and trichomoniasis.  Antifungal medicines, such as vaginal creams or suppositories, to treat a yeast infection.  Medicine to ease discomfort if you have viral vaginitis. Your sexual partner should also be treated.  Estrogen delivered in a cream, pill, suppository, or vaginal ring to treat atrophic vaginitis. If vaginal dryness occurs, lubricants and moisturizing creams may help. You may need to avoid scented soaps, sprays, or douches.  Stopping use of a product that is causing allergic vaginitis. Then using a vaginal cream to treat the symptoms.   Follow these instructions at home: Lifestyle  Keep your genital area clean and dry. Avoid soap, and only rinse the area with  water.  Do not douche or use tampons until your health care provider says it is okay to do so. Use sanitary pads, if needed.  Do not have sex until your health care provider approves. When you can return to sex, practice safe sex and use condoms.  Wipe from front to back. This avoids the spread of bacteria from the rectum to the vagina. General instructions  Take over-the-counter and prescription medicines only as told by your health care provider.  If you were prescribed an antibiotic medicine, take or use it as told by your health care provider. Do not stop taking or using the antibiotic even if you start to feel better.  Keep all follow-up visits as told by your health care provider. This is important. How is this prevented?  Use mild, non-scented products. Do not use things that can irritate the vagina, such as fabric softeners. Avoid the following products if they are scented: ? Feminine sprays. ? Detergents. ? Tampons. ? Feminine hygiene products. ? Soaps or bubble baths.  Let air reach your genital area. ? Wear cotton underwear to reduce moisture buildup. ? Avoid wearing underwear while you sleep. ? Avoid wearing tight pants and underwear or nylons without a cotton panel. ? Avoid wearing thong underwear.  Take off any wet clothing, such as bathing suits, as soon as possible.  Practice safe sex and use condoms. Contact a health care provider if:  You have abdominal pain.  You have a fever.  You have symptoms that last for more than 2-3 days. Get help right away if:  You have a fever and your symptoms suddenly get worse. Summary  Vaginitis is a condition in which the vaginal tissue becomes inflamed.This condition is most often caused by a change in the normal balance of bacteria and yeast that live in the vagina.  Treatment varies depending on the type of vaginitis you have.  Do not douche, use tampons , or have sex until your health care provider approves. When  you can return to sex, practice safe sex and use condoms. This information is not intended to replace advice given to you by your health care provider. Make sure you discuss any questions you have with your health care provider. Document Revised: 08/17/2017 Document Reviewed: 10/10/2016 Elsevier Patient Education  2020 Else[employer] Inc[employer]   Preventive Care 21-39 Years Old, Female Preventive care refers to visits with your health care provider and lifestyle choices that can promote health and wellness. This includes:  A yearly physical exam. This may also be called an annual well check.  Regular dental visits and eye exams.  Immunizations.  Screening for certain conditions.  Healthy lifestyle choices, such as eating a healthy diet, getting regular exercise, not using drugs or products that contain nicotine and tobacco, and limiting alcohol use. What can I expect for my preventive care visit? Physical exam Your health care provider will check your:  Height and weight. This may be used to calculate body mass index (BMI), which tells if you are at a healthy weight.  Heart rate and blood pressure.  Skin for abnormal spots. Counseling Your health care provider may ask you questions about your:  Alcohol, tobacco, and drug use.  Emotional well-being.  Home and relationship well-being.  Sexual activity.  Eating habits.  Work and work environment.  Method of birth control.  Menstrual cycle.    Pregnancy history. What immunizations do I need?  Influenza (flu) vaccine  This is recommended every year. Tetanus, diphtheria, and pertussis (Tdap) vaccine  You may need a Td booster every 10 years. Varicella (chickenpox) vaccine  You may need this if you have not been vaccinated. Human papillomavirus (HPV) vaccine  If recommended by your health care provider, you may need three doses over 6 months. Measles, mumps, and rubella (MMR) vaccine  You may need at least one dose of MMR.  You may also need a second dose. Meningococcal conjugate (MenACWY) vaccine  One dose is recommended if you are age 19-21 years and a first-year college student living in a residence hall, or if you have one of several medical conditions. You may also need additional booster doses. Pneumococcal conjugate (PCV13) vaccine  You may need this if you have certain conditions and were not previously vaccinated. Pneumococcal polysaccharide (PPSV23) vaccine  You may need one or two doses if you smoke cigarettes or if you have certain conditions. Hepatitis A vaccine  You may need this if you have certain conditions or if you travel or work in places where you may be exposed to hepatitis A. Hepatitis B vaccine  You may need this if you have certain conditions or if you travel or work in places where you may be exposed to hepatitis B. Haemophilus influenzae type b (Hib) vaccine  You may need this if you have certain conditions. You may receive vaccines as individual doses or as more than one vaccine together in one shot (combination vaccines). Talk with your health care provider about the risks and benefits of combination vaccines. What tests do I need?  Blood tests  Lipid and cholesterol levels. These may be checked every 5 years starting at age 20.  Hepatitis C test.  Hepatitis B test. Screening  Diabetes screening. This is done by checking your blood sugar (glucose) after you have not eaten for a while (fasting).  Sexually transmitted disease (STD) testing.  BRCA-related cancer screening. This may be done if you have a family history of breast, ovarian, tubal, or peritoneal cancers.  Pelvic exam and Pap test. This may be done every 3 years starting at age 21. Starting at age 30, this may be done every 5 years if you have a Pap test in combination with an HPV test. Talk with your health care provider about your test results, treatment options, and if necessary, the need for more  tests. Follow these instructions at home: Eating and drinking   Eat a diet that includes fresh fruits and vegetables, whole grains, lean protein, and low-fat dairy.  Take vitamin and mineral supplements as recommended by your health care provider.  Do not drink alcohol if: ? Your health care provider tells you not to drink. ? You are pregnant, may be pregnant, or are planning to become pregnant.  If you drink alcohol: ? Limit how much you have to 0-1 drink a day. ? Be aware of how much alcohol is in your drink. In the [place] one drink equals one 12 oz bottle of beer (355 mL), one 5 oz glass of wine (148 mL), or one 1 oz glass of hard liquor (44 mL). Lifestyle  Take daily care of your teeth and gums.  Stay active. Exercise for at least 30 minutes on 5 or more days each week.  Do not use any products that contain nicotine or tobacco, such as cigarettes, e-cigarettes, and chewing tobacco. If you need help quitting, ask your   health care provider.  If you are sexually active, practice safe sex. Use a condom or other form of birth control (contraception) in order to prevent pregnancy and STIs (sexually transmitted infections). If you plan to become pregnant, see your health care provider for a preconception visit. What's next?  Visit your health care provider once a year for a well check visit.  Ask your health care provider how often you should have your eyes and teeth checked.  Stay up to date on all vaccines. This information is not intended to replace advice given to you by your health care provider. Make sure you discuss any questions you have with your health care provider. Document Revised: 05/16/2018 Document Reviewed: 05/16/2018 Elsevier Patient Education  2020 [employer].

## 2020-09-07 NOTE — Progress Notes (Signed)
GYNECOLOGY ANNUAL PREVENTATIVE CARE ENCOUNTER NOTE  History:     Beth [name] is a 34 y.o. [phone] female here for a routine annual gynecologic exam.  Current complaints: some pelvic pain, desires removal of IUD. Husband had a vasectomy, does not need this anymore.  Also reports having abnormal vaginal discharge, worried about vaginitis.   Denies abnormal vaginal bleeding, problems with intercourse or other gynecologic concerns.    Gynecologic History No LMP recorded. (Menstrual status: IUD). Contraception: vasectomy Last Pap: 07/02/2018. Results were: normal with negative HPV  Obstetric History OB History  Gravida Para Term Preterm AB Living  7 6 6  0 1 6  SAB IAB Ectopic Multiple Live Births  1 0 0 0 6    # Outcome Date GA Lbr Len/2nd Weight Sex Delivery Anes PTL Lv  7 SAB 2018          6 Term 04/04/14 [redacted]w[redacted]d 28:35 / 00:37 9 lb 2 oz (4.139 kg) M Vag-Spont EPI  LIV  5 Term 06/07/12 [redacted]w[redacted]d 04:02 / 00:16 7 lb 4 oz (3.289 kg) F Vag-Spont None  LIV  4 Term 2009 [redacted]w[redacted]d   M Vag-Spont EPI N LIV  3 Term 2008 [redacted]w[redacted]d   F Vag-Spont EPI N LIV  2 Term 2006 [redacted]w[redacted]d   M Vag-Spont EPI N LIV  1 Term 2005 [redacted]w[redacted]d   M Vag-Spont EPI N LIV    Past Medical History:  Diagnosis Date  . Medical history non-contributory     Past Surgical History:  Procedure Laterality Date  . NO PAST SURGERIES      No current outpatient medications on file prior to visit.   No current facility-administered medications on file prior to visit.    No Known Allergies  Social History:  reports that she has never smoked. She has never used smokeless tobacco. She reports that she does not drink alcohol and does not use drugs.  Family History  Problem Relation Age of Onset  . Diabetes type I Father 9  . Hypertension Father     The following portions of the patient's history were reviewed and updated as appropriate: allergies, current medications, past family history, past medical history, past social history,  past surgical history and problem list.  Review of Systems Pertinent items noted in HPI and remainder of comprehensive ROS otherwise negative.  Physical Exam:  BP 126/78   Pulse 74   Ht 5\' 6"  (1.676 m)   Wt 284 lb (128.8 kg)   BMI 45.84 kg/m  CONSTITUTIONAL: Well-developed, well-nourished female in no acute distress.  HENT:  Normocephalic, atraumatic, External right and left ear normal.  EYES: Conjunctivae and EOM are normal. Pupils are equal, round, and reactive to light. No scleral icterus.  NECK: Normal range of motion, supple, no masses.  Normal thyroid.  SKIN: Skin is warm and dry. No rash noted. Not diaphoretic. No erythema. No pallor. MUSCULOSKELETAL: Normal range of motion. No tenderness.  No cyanosis, clubbing, or edema.   NEUROLOGIC: Alert and oriented to person, place, and time. Normal reflexes, muscle tone coordination. No cranial nerve deficit noted. PSYCHIATRIC: Normal mood and affect. Normal behavior. Normal judgment and thought content. CARDIOVASCULAR: Normal heart rate noted, regular rhythm RESPIRATORY: Clear to auscultation bilaterally. Effort and breath sounds normal, no problems with respiration noted. BREASTS: Symmetric in size. No masses, tenderness, skin changes, nipple drainage, or lymphadenopathy bilaterally.  Performed in the presence of a chaperone. ABDOMEN: Soft, obese, no distention appreciated.  No tenderness, rebound or guarding.  PELVIC:  Normal appearing external genitalia and urethral meatus; normal appearing vaginal mucosa and cervix. IUD strings seen.  Scant clear-yellow discharge, testing sample obtained.  Pap smear obtained.  Unable to palpate uterus or adnexa fully secondary to habitus, but no abnormalities palpated.  Performed in the presence of a chaperone.  IUD Removal  Patient identified, informed consent performed, consent signed.  During the pelvic exam, the strings of the IUD were visualized.  After the pap smear, the strings were grasped and  pulled using ring forceps. The IUD was removed in its entirety.  Patient tolerated the procedure well.     Assessment and Plan:      1. Encounter for IUD removal Mirena removed successfully.  Patient was counseled about her periods that could be longer or heavier s/p removal.  If this happens, she reports she will want OCPs instead. Will monitor for now.   2. Vaginal discharge - Cervicovaginal ancillary only done, will follow up results and manage accordingly. Talked about boric acid therapy given recurrent vaginitis reported by her, she will consider this. Proper vulvar hygiene emphasized.   3. Encounter for well woman exam with routine gynecological exam - Cytology - PAP - CBC; Future - TSH; Future - Hemoglobin A1c; Future - Comprehensive metabolic panel; Future - Lipid panel; Future  Will follow up results of pap smear and manage accordingly. Other preventative healthcare maintenance labs to be drawn later in a fasting state.  Routine preventative health maintenance measures emphasized. Please refer to After Visit Summary for other counseling recommendations.      Jaynie Collins, MD, FACOG Obstetrician & Gynecologist, [facility] [employer], [facility]

## 2020-09-09 LAB — CERVICOVAGINAL ANCILLARY ONLY
Bacterial Vaginitis (gardnerella): NEGATIVE
Candida Glabrata: NEGATIVE
Candida Vaginitis: POSITIVE — AB
Comment: NEGATIVE
Comment: NEGATIVE
Comment: NEGATIVE

## 2020-09-09 MED ORDER — FLUCONAZOLE 150 MG PO TABS
150.0000 mg | ORAL_TABLET | Freq: Once | ORAL | 3 refills | Status: AC
Start: 2020-09-09 — End: 2020-09-09

## 2020-09-09 MED ORDER — BORIC ACID CRYS: 600.0000 mg | CRYSTALS | Freq: Every day | 5 refills | Status: AC

## 2020-09-09 NOTE — Addendum Note (Signed)
Addended by: Jaynie Collins A on: 09/09/2020 03:44 PM   Modules accepted: Orders

## 2020-09-14 LAB — CYTOLOGY - PAP
Comment: NEGATIVE
Diagnosis: NEGATIVE
High risk HPV: NEGATIVE

## 2020-12-10 ENCOUNTER — Other Ambulatory Visit: Payer: Self-pay

## 2020-12-10 ENCOUNTER — Ambulatory Visit (INDEPENDENT_AMBULATORY_CARE_PROVIDER_SITE_OTHER): Payer: 59 | Admitting: *Deleted

## 2020-12-10 VITALS — BP 126/80 | HR 71

## 2020-12-10 DIAGNOSIS — Z23 Encounter for immunization: Secondary | ICD-10-CM

## 2020-12-10 NOTE — Progress Notes (Signed)
Pt her for Flu injection, pt tolerated injection well.

## 2020-12-13 NOTE — Progress Notes (Signed)
Patient was assessed and managed by nursing staff during this encounter. I have reviewed the chart and agree with the documentation and plan. I have also made any necessary editorial changes.  Jaynie Collins, MD 12/13/2020 3:12 PM

## 2021-07-28 ENCOUNTER — Encounter: Payer: Self-pay | Admitting: Radiology

## 2021-09-21 ENCOUNTER — Ambulatory Visit: Payer: 59 | Admitting: Family Medicine

## 2022-05-31 ENCOUNTER — Ambulatory Visit: Payer: 59 | Admitting: Family Medicine

## 2022-07-26 ENCOUNTER — Encounter: Payer: Self-pay | Admitting: Family Medicine

## 2022-07-26 ENCOUNTER — Ambulatory Visit: Payer: 59 | Admitting: Family Medicine

## 2022-07-26 NOTE — Progress Notes (Signed)
Patient did not keep appointment today. She may call to reschedule.  

## 2022-08-21 NOTE — Progress Notes (Deleted)
   HPI: Ms.Beth [name] is a 35 y.o. female, who is here today to establish care.  Former PCP: Dr. Shawnie Pons Last preventive routine visit: ***  Chronic medical problems: ***  Concerns today: ***  Review of Systems See other pertinent positives and negatives in HPI.  Current Outpatient Medications on File Prior to Visit  Medication Sig Dispense Refill   Boric Acid [facility] 600 mg vaginally at bedtime. Use vaginally every night for two weeks then twice a week for six months 500 g 5   No current facility-administered medications on file prior to visit.    Past Medical History:  Diagnosis Date   Medical history non-contributory    No Known Allergies  Family History  Problem Relation Age of Onset   Diabetes type I Father 57   Hypertension Father     Social History   Socioeconomic History   Marital status: Legally Separated    Spouse name: Not on file   Number of children: Not on file   Years of education: Not on file   Highest education level: Not on file  Occupational History   Not on file  Tobacco Use   Smoking status: Never   Smokeless tobacco: Never  Vaping Use   Vaping Use: Never used  Substance and Sexual Activity   Alcohol use: No   Drug use: No   Sexual activity: Yes    Birth control/protection: I.U.D.  Other Topics Concern   Not on file  Social History Narrative   Not on file   Social Determinants of Health   Financial Resource Strain: Not on file  Food Insecurity: Not on file  Transportation Needs: Not on file  Physical Activity: Not on file  Stress: Not on file  Social Connections: Not on file    There were no vitals filed for this visit.  There is no height or weight on file to calculate BMI.  Physical Exam  ASSESSMENT AND PLAN: There are no diagnoses linked to this encounter.  There are no diagnoses linked to this encounter.  No follow-ups on file.  Beth G. [place], MD  [facility]. [place] office.

## 2022-08-22 ENCOUNTER — Ambulatory Visit: Payer: 59 | Admitting: Family Medicine

## 2023-05-23 ENCOUNTER — Encounter: Payer: Self-pay | Admitting: Family Medicine

## 2023-05-23 ENCOUNTER — Ambulatory Visit: Payer: 59 | Admitting: Family Medicine

## 2023-05-23 NOTE — Progress Notes (Signed)
Patient did not keep appointment today. She may call to reschedule.  

## 2023-07-11 ENCOUNTER — Other Ambulatory Visit (HOSPITAL_COMMUNITY)
Admission: RE | Admit: 2023-07-11 | Discharge: 2023-07-11 | Disposition: A | Discharge status: Home / Self Care | Payer: 59 | Source: Ambulatory Visit | Attending: Family Medicine | Admitting: Family Medicine

## 2023-07-11 ENCOUNTER — Encounter: Payer: Self-pay | Admitting: Family Medicine

## 2023-07-11 ENCOUNTER — Ambulatory Visit: Payer: 59 | Admitting: Family Medicine

## 2023-07-11 VITALS — BP 124/79 | HR 74 | Ht 66.0 in | Wt 290.0 lb

## 2023-07-11 DIAGNOSIS — Z1339 Encounter for screening examination for other mental health and behavioral disorders: Secondary | ICD-10-CM | POA: Diagnosis not present

## 2023-07-11 DIAGNOSIS — Z124 Encounter for screening for malignant neoplasm of cervix: Secondary | ICD-10-CM | POA: Insufficient documentation

## 2023-07-11 DIAGNOSIS — Z01419 Encounter for gynecological examination (general) (routine) without abnormal findings: Secondary | ICD-10-CM

## 2023-07-11 NOTE — Progress Notes (Signed)
Patient presents for Annual.  LMP: 06/16/2023 Monthly Last pap:  06/29/2020 WNL  Contraception: None and None desired Mammogram: Not yet indicated NO Family Hx of Breast Cancer  STD Screening: Declines   CC: Annual/None  Fun Fact: Loves to cook.

## 2023-07-11 NOTE — Patient Instructions (Signed)

## 2023-07-11 NOTE — Progress Notes (Signed)
Subjective:     [name] is a 37 y.o. female and is here for a comprehensive physical exam. The patient reports no problems.    The following portions of the patient's history were reviewed and updated as appropriate: allergies, current medications, past family history, past medical history, past social history, past surgical history, and problem list.  Review of Systems Pertinent items noted in HPI and remainder of comprehensive ROS otherwise negative.   Objective:  Chaperone present for exam   BP 124/79   Pulse 74   Ht 5\' 6"  (1.676 m)   Wt 290 lb (131.5 kg)   LMP 06/16/2023   BMI 46.81 kg/m  General appearance: alert, cooperative, and appears stated age Head: Normocephalic, without obvious abnormality, atraumatic Neck: no adenopathy, supple, symmetrical, trachea midline, and thyroid not enlarged, symmetric, no tenderness/mass/nodules Lungs: clear to auscultation bilaterally Breasts: normal appearance, no masses or tenderness Heart: regular rate and rhythm, S1, S2 normal, no murmur, click, rub or gallop Abdomen: soft, non-tender; bowel sounds normal; no masses,  no organomegaly Pelvic: cervix normal in appearance, external genitalia normal, no adnexal masses or tenderness, no cervical motion tenderness, uterus normal size, shape, and consistency, and vagina normal without discharge Extremities: extremities normal, atraumatic, no cyanosis or edema Pulses: 2+ and symmetric Skin: Skin color, texture, turgor normal. No rashes or lesions Lymph nodes: Cervical, supraclavicular, and axillary nodes normal. Neurologic: Grossly normal    Assessment:    Healthy female exam.      Plan:  Screening for malignant neoplasm of cervix - Plan: Cytology - PAP, CANCELED: Cytology - PAP  Encounter for gynecological examination without abnormal finding - Plan: CBC, Comprehensive metabolic panel, TSH, Hemoglobin A1c, Lipid panel  Return in 1 year (on 07/10/2024).      See After  Visit Summary for Counseling Recommendations

## 2023-07-17 LAB — CYTOLOGY - PAP
Comment: NEGATIVE
Diagnosis: NEGATIVE
High risk HPV: NEGATIVE
